# Patient Record
Sex: Male | Born: 2000 | Race: White | Hispanic: No
Health system: Southern US, Community
[De-identification: ages and names within clinical notes are randomized; demographics above are authoritative.]

## PROBLEM LIST (undated history)

## (undated) DIAGNOSIS — F32A Depression, unspecified: Secondary | ICD-10-CM

## (undated) DIAGNOSIS — N200 Calculus of kidney: Secondary | ICD-10-CM

## (undated) DIAGNOSIS — Z8709 Personal history of other diseases of the respiratory system: Secondary | ICD-10-CM

## (undated) DIAGNOSIS — F419 Anxiety disorder, unspecified: Secondary | ICD-10-CM

## (undated) DIAGNOSIS — J45909 Unspecified asthma, uncomplicated: Secondary | ICD-10-CM

## (undated) HISTORY — DX: Anxiety disorder, unspecified: F41.9

## (undated) HISTORY — DX: Personal history of other diseases of the respiratory system: Z87.09

## (undated) HISTORY — DX: Depression, unspecified: F32.A

## (undated) HISTORY — DX: Unspecified asthma, uncomplicated: J45.909

---
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
## 2020-12-03 ENCOUNTER — Other Ambulatory Visit: Payer: Self-pay

## 2020-12-03 ENCOUNTER — Ambulatory Visit (INDEPENDENT_AMBULATORY_CARE_PROVIDER_SITE_OTHER): Payer: BLUE CROSS/BLUE SHIELD | Admitting: Medical

## 2020-12-03 ENCOUNTER — Encounter: Payer: Self-pay | Admitting: Medical

## 2020-12-03 VITALS — BP 140/86 | HR 102 | Ht 75.0 in | Wt 272.6 lb

## 2020-12-03 DIAGNOSIS — Z7722 Contact with and (suspected) exposure to environmental tobacco smoke (acute) (chronic): Secondary | ICD-10-CM | POA: Diagnosis not present

## 2020-12-03 DIAGNOSIS — J4530 Mild persistent asthma, uncomplicated: Secondary | ICD-10-CM

## 2020-12-03 DIAGNOSIS — Z8709 Personal history of other diseases of the respiratory system: Secondary | ICD-10-CM | POA: Diagnosis not present

## 2020-12-03 DIAGNOSIS — R0600 Dyspnea, unspecified: Secondary | ICD-10-CM

## 2020-12-03 DIAGNOSIS — K644 Residual hemorrhoidal skin tags: Secondary | ICD-10-CM

## 2020-12-03 DIAGNOSIS — Z6834 Body mass index (BMI) 34.0-34.9, adult: Secondary | ICD-10-CM | POA: Insufficient documentation

## 2020-12-03 MED ORDER — BREO ELLIPTA 100-25 MCG/INH IN AEPB: 1.0000 | INHALATION_SPRAY | Freq: Every day | RESPIRATORY_TRACT | 0 refills | Status: DC

## 2020-12-03 MED ORDER — HYDROCORTISONE 2.5 % EX CREA: TOPICAL_CREAM | Freq: Two times a day (BID) | CUTANEOUS | 1 refills | Status: DC

## 2020-12-03 NOTE — Progress Notes (Signed)
Subjective:  Ray Blanchard is a 20 y.o. male who presents for Chief Complaint  Patient presents with  . New Patient (Initial Visit)    Pt present as a new patient establishing care. Experiencing some breathing problems for several years. States he feels that he's not getting enough oxygen.      Here as a new patient.   No recent primary care in years  Has had "breathing problems" for years. In adolescent had "bronchitis" 5-7 times per year.   Over time feels like his winded leaves him , can't catch his breath.   When younger, never tested for asthma, but had to have several breathing treatments for bronchitis in younger years. He wants to be tested for breathing issues. Last time he was treated for bronchitis was about age 14yo  Nonsmoker. Grew up in smoker household.  Both parents smoke up until he was about 7yo.  But both parents quit smoking about the time he was 7.     Doesn't engage in any activities that would expose to smoke or chemical now.     Has a spot on his butt that he wants looked at.  Wonders about hemorrhoid .  Uses some hemorrhoid cream occasionally.  Flares up from time to time, but improved with hemorrhoid cream.    PGF had prostate cancer.   He notes BP has been told at times it can be elevated, but he always attributes this to nervousness.    Has scalp issue, can get skin irritation, flakiness, wonders about psoriasis.  Has strong family hx/o depression.  He has thought about seeing a Veterinary surgeon.  No prior medication for mood.  (of note, he brought this up at the end of the visit as I was getting up to be finished for the visit)  No other aggravating or relieving factors.    No other c/o.  Past Medical History:  Diagnosis Date  . Anxiety   . History of frequent upper respiratory infection    since childhood   Current Outpatient Medications on File Prior to Visit  Medication Sig Dispense Refill  . Probiotic Product (PROBIOTIC BLEND PO) Take by mouth.      No current facility-administered medications on file prior to visit.   Family History  Problem Relation Age of Onset  . Hypertension Mother   . Depression Mother   . Kidney Stones Mother   . Depression Father   . Gout Father   . Diabetes Maternal Grandmother   . Depression Maternal Grandmother   . Obesity Maternal Grandmother   . Alcohol abuse Maternal Grandfather   . Diabetes Paternal Grandmother   . Obesity Paternal Grandmother   . Diabetes Paternal Grandfather   . Cancer Paternal Grandfather        prostate     The following portions of the patient's history were reviewed and updated as appropriate: allergies, current medications, past family history, past medical history, past social history, past surgical history and problem list.  ROS Otherwise as in subjective above  Objective: BP 140/86   Pulse (!) 102   Ht 6\' 3"  (1.905 m)   Wt 272 lb 9.6 oz (123.7 kg)   SpO2 98%   BMI 34.07 kg/m   General appearance: alert, no distress, well developed, well nourished, obese white male Skin: unremarkable HEENT: normocephalic, sclerae anicteric, conjunctiva pink and moist, TMs pearly, nares patent, no discharge or erythema, pharynx normal Oral cavity: MMM, no lesions Neck: supple, no lymphadenopathy, no thyromegaly, no masses,  no bruits, no jvd Heart: RRR, normal S1, S2, no murmurs Lungs: CTA bilaterally, no wheezes, rhonchi, or rales Abdomen: +bs, soft, non tender, non distended, no masses, no hepatomegaly, no splenomegaly Pulses: 2+ radial pulses, 2+ pedal pulses, normal cap refill Ext: no edema Rectal -left side of perirectal area with somewhat purplish non thrombosed external hemorrhoid, small to medium size otherwise no fissure or other abnormality  PFT reviewed  Assessment: Encounter Diagnoses  Name Primary?  Marland Kitchen Dyspnea, unspecified type Yes  . History of frequent upper respiratory infection   . History of second hand smoke exposure   . BMI 34.0-34.9,adult   .  External hemorrhoid   . Mild persistent asthma without complication      Plan: Dyspnea, history of frequent URI as a kid, history of secondhand smoke exposure in childhood-PFTs suggest mild asthma and likely some restrictive effect of being obese.  He will begin trial of Breo inhaler for the next 2 weeks to see if this makes any difference in his perceived dyspnea.  Advise he begin daily allergy pill such as Zyrtec over-the-counter for the next month and a half.  Discussed proper use of medicine.  Had him do his first dose today as I demonstrated proper use .  recheck in 2 weeks  External hemorrhoid-advised good hydration and fiber intake daily, avoid straining on the toilet bowl, avoid heavy lifting, can use hydrocortisone prescription cream as needed, sitz bath's, recheck in 2 weeks  BMI greater than 34-counseled on diet and exercise along with efforts to lose weight, which would help his asthma breathing issues  Depressed mood-he brought this up at the very end of the visit.  I offered to send him to a therapist as a first step.  He wants to wait until we recheck given his work schedule.  We will discuss this further at next visit as we did not have enough time to a lot for this today  Ray Blanchard was seen today for new patient (initial visit).  Diagnoses and all orders for this visit:  Dyspnea, unspecified type  History of frequent upper respiratory infection  History of second hand smoke exposure -     Spirometry with Graph  BMI 34.0-34.9,adult  External hemorrhoid  Mild persistent asthma without complication  Other orders -     hydrocortisone 2.5 % cream; Apply topically 2 (two) times daily. -     fluticasone furoate-vilanterol (BREO ELLIPTA) 100-25 MCG/INH AEPB; Inhale 1 puff into the lungs daily.    Follow up: 2wk

## 2020-12-10 HISTORY — PX: NO PAST SURGERIES: SHX2092

## 2020-12-20 ENCOUNTER — Encounter: Payer: Self-pay | Admitting: Medical

## 2020-12-20 ENCOUNTER — Ambulatory Visit: Payer: BLUE CROSS/BLUE SHIELD | Admitting: Medical

## 2020-12-20 ENCOUNTER — Other Ambulatory Visit: Payer: Self-pay

## 2020-12-20 VITALS — BP 134/88 | HR 94 | Ht 75.0 in | Wt 273.2 lb

## 2020-12-20 DIAGNOSIS — J4540 Moderate persistent asthma, uncomplicated: Secondary | ICD-10-CM

## 2020-12-20 DIAGNOSIS — K644 Residual hemorrhoidal skin tags: Secondary | ICD-10-CM | POA: Diagnosis not present

## 2020-12-20 MED ORDER — ALBUTEROL SULFATE HFA 108 (90 BASE) MCG/ACT IN AERS: 2.0000 | INHALATION_SPRAY | Freq: Four times a day (QID) | RESPIRATORY_TRACT | 1 refills | Status: DC | PRN

## 2020-12-20 MED ORDER — BREO ELLIPTA 100-25 MCG/INH IN AEPB: 1.0000 | INHALATION_SPRAY | Freq: Two times a day (BID) | RESPIRATORY_TRACT | 2 refills | Status: DC

## 2020-12-20 NOTE — Progress Notes (Signed)
Subjective:  Ray Blanchard is a 20 y.o. male who presents for Chief Complaint  Patient presents with  . Shortness of Breath    Pt present for 2 week follow up on shortness of breath. Pt states he feels better      Recently established here as a new patient on 12/03/20.  On that visit we started Breo sample and allergy medication daily to help with asthma diagnosis.   He reports improvements with the Breo compared to prior.  He notes at night gets hot in his room which can seem to affect his breathing.    Has had "breathing problems" for years. In adolescent had "bronchitis" 5-7 times per year.   Over time feels like his winded leaves him , can't catch his breath.   When younger, never tested for asthma, but had to have several breathing treatments for bronchitis in younger years. He wants to be tested for breathing issues. Last time he was treated for bronchitis was about age 45yo  Nonsmoker. Grew up in smoker household.  Both parents smoke up until he was about 7yo.  But both parents quit smoking about the time he was 7.     Doesn't engage in any activities that would expose to smoke or chemical now.     Last visit we discussed hemorrhoids, constipation.  Has been eating more salads, more fiber, more water intake.   Rectal cream has helped.  Has also tried some sitz baths.     No other aggravating or relieving factors.    No other c/o.  Past Medical History:  Diagnosis Date  . Anxiety   . History of frequent upper respiratory infection    since childhood   Current Outpatient Medications on File Prior to Visit  Medication Sig Dispense Refill  . hydrocortisone 2.5 % cream Apply topically 2 (two) times daily. 30 g 1  . Probiotic Product (PROBIOTIC BLEND PO) Take by mouth.     No current facility-administered medications on file prior to visit.   Family History  Problem Relation Age of Onset  . Hypertension Mother   . Depression Mother   . Kidney Stones Mother   . Depression  Father   . Gout Father   . Diabetes Maternal Grandmother   . Depression Maternal Grandmother   . Obesity Maternal Grandmother   . Alcohol abuse Maternal Grandfather   . Diabetes Paternal Grandmother   . Obesity Paternal Grandmother   . Diabetes Paternal Grandfather   . Cancer Paternal Grandfather        prostate   The following portions of the patient's history were reviewed and updated as appropriate: allergies, current medications, past family history, past medical history, past social history, past surgical history and problem list.  ROS Otherwise as in subjective above  Objective: BP 134/88   Pulse 94   Ht 6\' 3"  (1.905 m)   Wt 273 lb 3.2 oz (123.9 kg)   SpO2 98%   BMI 34.15 kg/m   Wt Readings from Last 3 Encounters:  12/20/20 273 lb 3.2 oz (123.9 kg)  12/03/20 272 lb 9.6 oz (123.7 kg)    General appearance: alert, no distress, well developed, well nourished, obese white male Neck: supple, no lymphadenopathy, no thyromegaly, no masses, no bruits, no jvd Heart: RRR, normal S1, S2, no murmurs Lungs: CTA bilaterally, no wheezes, rhonchi, or rales Pulses: 2+ radial pulses, 2+ pedal pulses, normal cap refill Ext: no edema  PFT reviewed from last visit  Assessment: Encounter Diagnoses  Name Primary?  . Moderate persistent asthma, unspecified whether complicated Yes  . External hemorrhoid      Plan: Asthma: . Avoid asthma triggers such as pollen, smoke, or cold temperatures for example . Your asthma is currently considered mild persistent asthma. Rickard Rhymes Breo inhaled medication twice daily for prevention.  This medication is a Chief of Staff" medication. . You may use "Albuterol" rescue inhaler 1-2 puffs every 4-6 hours as needed for wheezing, shortness of breath, chest tightness or cough spells.   This is a Data processing manager" medication. . You may use "Albuterol" rescue nebulized treatment every 4-6 hours as needed for wheezing, shortness of breath, chest tightness or cough  spells.   This is a Data processing manager" medication. . Continue your allergy tablet daily at bedtime to help reduce allergy symptoms which can trigger asthma symptoms.  . Some people need to continue this regimen in the spring and fall, some people may only need to use this regimen during times of respiratory illness such as colds and flu, but some people with severe asthma need to continue this regimen year round. . Call if you have questions, or call or return if you are not improving on this regimen.  . Plan to recheck/call back to give me update on asthma in the summer   ASTHMA MEDICATIONS Two types of medications are used in asthma treatment: quick relief (or rescue) medications and controller medications. All patients with persistent asthma should have a short-acting relief medication on hand for treatment of exacerbations and a controller medication for long-term control.    Rescue medications include Albuterol, Proventil, Ventolin, ProAir, Xopenex.  Rescue medications, also called quick-relief or fast-acting medications, work immediately to relieve asthma symptoms when they occur. They're often inhaled directly into the lungs, where they open up the airways and relieve symptoms such as wheezing, coughing, and shortness of breath, often within minutes. But as effective as they are, rescue medications don't have a long-term effect.  Controller medications include inhaled corticosteroids (ICSs), long-acting beta agonists (LABAs), leukotriene modifiers, anti-IgE medications, and combination products.  Examples include Qvar, Asmanex, Pulmicort, Advair, Symbicort, and Dulera.  Controller medications, also called preventive or maintenance medications, work over a period of time to reduce airway inflammation and help prevent asthma symptoms from occurring. They may be inhaled or swallowed as a pill or liquid.   External hemorrhoid - improved.  C/t good hydration and fiber intake daily, avoid straining on the  toilet bowl, avoid heavy lifting, can use hydrocortisone prescription cream as needed, sitz bath's  Titus was seen today for shortness of breath.  Diagnoses and all orders for this visit:  Moderate persistent asthma, unspecified whether complicated  External hemorrhoid  Other orders -     fluticasone furoate-vilanterol (BREO ELLIPTA) 100-25 MCG/INH AEPB; Inhale 1 puff into the lungs in the morning and at bedtime. -     albuterol (VENTOLIN HFA) 108 (90 Base) MCG/ACT inhaler; Inhale 2 puffs into the lungs every 6 (six) hours as needed for wheezing or shortness of breath.    Follow up: 2wk

## 2020-12-20 NOTE — Patient Instructions (Signed)
Asthma: . Avoid asthma triggers such as pollen, smoke, or cold temperatures for example . Your asthma is currently considered mild persistent asthma. Rickard Rhymes Breo inhaled medication twice daily for prevention.  This medication is a Chief of Staff" medication. . You may use "Albuterol" rescue inhaler 1-2 puffs every 4-6 hours as needed for wheezing, shortness of breath, chest tightness or cough spells.   This is a Data processing manager" medication. . You may use "Albuterol" rescue nebulized treatment every 4-6 hours as needed for wheezing, shortness of breath, chest tightness or cough spells.   This is a Data processing manager" medication. . Continue your allergy tablet daily at bedtime to help reduce allergy symptoms which can trigger asthma symptoms.  . Some people need to continue this regimen in the spring and fall, some people may only need to use this regimen during times of respiratory illness such as colds and flu, but some people with severe asthma need to continue this regimen year round. . Call if you have questions, or call or return if you are not improving on this regimen.  . Plan to recheck/call back to give me update on asthma in the summer   ASTHMA MEDICATIONS Two types of medications are used in asthma treatment: quick relief (or rescue) medications and controller medications. All patients with persistent asthma should have a short-acting relief medication on hand for treatment of exacerbations and a controller medication for long-term control.    Rescue medications include Albuterol, Proventil, Ventolin, ProAir, Xopenex.  Rescue medications, also called quick-relief or fast-acting medications, work immediately to relieve asthma symptoms when they occur. They're often inhaled directly into the lungs, where they open up the airways and relieve symptoms such as wheezing, coughing, and shortness of breath, often within minutes. But as effective as they are, rescue medications don't have a long-term  effect.  Controller medications include inhaled corticosteroids (ICSs), long-acting beta agonists (LABAs), leukotriene modifiers, anti-IgE medications, and combination products.  Examples include Qvar, Asmanex, Pulmicort, Advair, Symbicort, and Dulera.  Controller medications, also called preventive or maintenance medications, work over a period of time to reduce airway inflammation and help prevent asthma symptoms from occurring. They may be inhaled or swallowed as a pill or liquid.

## 2020-12-22 ENCOUNTER — Other Ambulatory Visit: Payer: Self-pay | Admitting: Medical

## 2020-12-22 MED ORDER — QVAR REDIHALER 80 MCG/ACT IN AERB: 2.0000 | INHALATION_SPRAY | Freq: Two times a day (BID) | RESPIRATORY_TRACT | 2 refills | Status: DC

## 2020-12-23 ENCOUNTER — Encounter: Payer: Self-pay | Admitting: Medical

## 2020-12-23 ENCOUNTER — Ambulatory Visit (INDEPENDENT_AMBULATORY_CARE_PROVIDER_SITE_OTHER): Payer: BLUE CROSS/BLUE SHIELD | Admitting: Medical

## 2020-12-23 ENCOUNTER — Other Ambulatory Visit: Payer: Self-pay

## 2020-12-23 VITALS — BP 130/80 | HR 89 | Ht 75.0 in | Wt 272.4 lb

## 2020-12-23 DIAGNOSIS — Z7185 Encounter for immunization safety counseling: Secondary | ICD-10-CM | POA: Insufficient documentation

## 2020-12-23 DIAGNOSIS — J4540 Moderate persistent asthma, uncomplicated: Secondary | ICD-10-CM

## 2020-12-23 DIAGNOSIS — Z13220 Encounter for screening for lipoid disorders: Secondary | ICD-10-CM

## 2020-12-23 DIAGNOSIS — Z1159 Encounter for screening for other viral diseases: Secondary | ICD-10-CM

## 2020-12-23 DIAGNOSIS — F419 Anxiety disorder, unspecified: Secondary | ICD-10-CM

## 2020-12-23 DIAGNOSIS — Z0000 Encounter for general adult medical examination without abnormal findings: Secondary | ICD-10-CM

## 2020-12-23 DIAGNOSIS — F32A Depression, unspecified: Secondary | ICD-10-CM

## 2020-12-23 DIAGNOSIS — Z113 Encounter for screening for infections with a predominantly sexual mode of transmission: Secondary | ICD-10-CM

## 2020-12-23 LAB — LIPID PANEL

## 2020-12-23 LAB — HEPATITIS C ANTIBODY

## 2020-12-23 LAB — CBC WITH DIFFERENTIAL/PLATELET
Basos: 1 %
MCH: 29.1 pg (ref 26.6–33.0)
MCHC: 34.2 g/dL (ref 31.5–35.7)
MCV: 85 fL (ref 79–97)
WBC: 6.1 10*3/uL (ref 3.4–10.8)

## 2020-12-23 LAB — COMPREHENSIVE METABOLIC PANEL
Chloride: 98 mmol/L (ref 96–106)
Potassium: 4.4 mmol/L (ref 3.5–5.2)

## 2020-12-23 MED ORDER — BUPROPION HCL ER (XL) 150 MG PO TB24: 150.0000 mg | ORAL_TABLET | Freq: Every day | ORAL | 2 refills | Status: DC

## 2020-12-23 NOTE — Progress Notes (Signed)
Subjective:     Ray Blanchard is a 20 y.o. male who presents for a physical  Here for routine physical, screening.    We briefly discussed mood his first visit.  Here to discuss this further.  Depressed mood since teenage years, 20yo around that time.   Home environment and school environment contributed to depression.  Dropped out of school and ended up going to get GED.  At one point with PGF passed, his father got real depressed.  Would like to try treatment    No prior counseling.  Working full time currently.   Engaged, has good relationship with fiance.   She came from abusive family.  Past Medical History:  Diagnosis Date  . Anxiety   . Asthma   . Depression   . History of frequent upper respiratory infection    since childhood    Family History  Problem Relation Age of Onset  . Hypertension Mother   . Depression Mother   . Kidney Stones Mother   . Depression Father   . Gout Father   . Diabetes Maternal Grandmother   . Depression Maternal Grandmother   . Obesity Maternal Grandmother   . Alcohol abuse Maternal Grandfather   . Diabetes Paternal Grandmother   . Obesity Paternal Grandmother   . Diabetes Paternal Grandfather   . Cancer Paternal Grandfather        prostate     Current Outpatient Medications:  .  albuterol (VENTOLIN HFA) 108 (90 Base) MCG/ACT inhaler, Inhale 2 puffs into the lungs every 6 (six) hours as needed for wheezing or shortness of breath., Disp: 18 g, Rfl: 1 .  beclomethasone (QVAR REDIHALER) 80 MCG/ACT inhaler, Inhale 2 puffs into the lungs 2 (two) times daily., Disp: 1 each, Rfl: 2 .  hydrocortisone 2.5 % cream, Apply topically 2 (two) times daily., Disp: 30 g, Rfl: 1 .  Probiotic Product (PROBIOTIC BLEND PO), Take by mouth., Disp: , Rfl:  .  buPROPion (WELLBUTRIN XL) 150 MG 24 hr tablet, Take 1 tablet (150 mg total) by mouth daily., Disp: 30 tablet, Rfl: 2  No Known Allergies   The following portions of the patient's history were reviewed  and updated as appropriate: allergies, current medications, past family history, past medical history, past social history, past surgical history.  Review of Systems A comprehensive review of systems was negative   Objective:    BP 130/80   Pulse 89   Ht 6\' 3"  (1.905 m)   Wt 272 lb 6.4 oz (123.6 kg)   SpO2 98%   BMI 34.05 kg/m   General Appearance:  Alert, cooperative, no distress, appropriate for age, WD/ WN, white male                            Head:  Normocephalic, without obvious abnormality                             Eyes:  PERRL, EOM's intact, conjunctiva and cornea clear, fundi benign, both eyes                             Ears:  TM pearly, external ear canals normal, both ears                            Nose:  Nares symmetrical, septum midline, mucosa pink, no lesions                                Throat:  Lips, tongue, and mucosa are moist, pink, and intact; teeth intact                             Neck:  Supple, no adenopathy, no thyromegaly, no tenderness/mass/nodules, no carotid bruit, no JVD                             Back:  Symmetrical, no curvature, ROM normal, no tenderness                           Lungs:  Clear to auscultation bilaterally, respirations unlabored                             Heart:  Normal PMI, regular rate & rhythm, S1 and S2 normal, no murmurs, rubs, or gallops                     Abdomen:  Striae, Soft, non-tender, bowel sounds active all four quadrants, no mass or organomegaly              Genitourinary: normal male genitalia, circ, no masses, no hernia         Musculoskeletal:  Normal upper and lower extremity ROM, tone and strength strong and symmetrical, all extremities; no joint pain or edema                                       Lymphatic:  No adenopathy             Skin/Hair/Nails:  Skin warm, dry and intact, no rashes or abnormal dyspigmentation                   Neurologic:  Alert and oriented x3, no cranial nerve deficits, normal  strength and tone, gait steady Psych- pleasant, good eye contact, answers questions appropriately   Depression screen [facility] 2/9 12/23/2020 12/03/2020  Decreased Interest 3 3  Down, Depressed, Hopeless 3 3  PHQ - 2 Score 6 6  Altered sleeping 3 1  Tired, decreased energy 3 1  Change in appetite 1 1  Feeling bad or failure about yourself  2 3  Trouble concentrating 2 1  Moving slowly or fidgety/restless 2 1  Suicidal thoughts 1 1  PHQ-9 Score 20 15  Difficult doing work/chores Very difficult -     Assessment:   Encounter Diagnoses  Name Primary?  . Encounter for health maintenance examination in adult Yes  . Vaccine counseling   . Moderate persistent asthma, unspecified whether complicated   . Anxiety and depression   . Screen for STD (sexually transmitted disease)   . Screening for lipid disorders   . Encounter for hepatitis C screening test for low risk patient       Plan:   Today you had a preventative care visit or wellness visit.    Topics today may have included healthy lifestyle, diet, exercise, preventative care, vaccinations, sick and well care, proper use of emergency dept  and after hours care, as well as other concerns.     Recommendations: Continue to return yearly for your annual wellness and preventative care visits.  This gives [place] a chance to discuss healthy lifestyle, exercise, vaccinations, review your chart record, and perform screenings where appropriate.  I recommend you see your eye doctor yearly for routine vision care.  I recommend you see your dentist yearly for routine dental care including hygiene visits twice yearly.   Vaccination recommendations were reviewed  Request prior vaccine records   Screening for cancer: Check testicles monthly for lumps  Skin cancer screening: Check your skin regularly for new changes, growing lesions, or other lesions of concern Come in for evaluation if you have skin lesions of concern.  Lung cancer  screening: If you have a greater than 30 pack year history of tobacco use, then you qualify for lung cancer screening with a chest CT scan  We currently don't have screenings for other cancers besides breast, cervical, colon, and lung cancers.  If you have a strong family history of cancer or have other cancer screening concerns, please let me know.    Bone health: Get at least 150 minutes of aerobic exercise weekly Get weight bearing exercise at least once weekly   Heart health: Get at least 150 minutes of aerobic exercise weekly Limit alcohol It is important to maintain a healthy blood pressure and healthy cholesterol numbers    Separate significant issues discussed: depression - referral to counseling, begin medicaiton below. Discussed risks/benefit of proper medicaiton.  Discussed f/u.   Asthma - doing fine on current regimen  Gryphon was seen today for annual exam.  Diagnoses and all orders for this visit:  Encounter for health maintenance examination in adult -     Comprehensive metabolic panel -     CBC with Differential/Platelet -     Lipid panel -     TSH -     HIV Antibody (routine testing w rflx) -     RPR -     GC/Chlamydia Probe Amp -     Hepatitis C antibody -     Hepatitis B surface antigen  Vaccine counseling  Moderate persistent asthma, unspecified whether complicated  Anxiety and depression  Screen for STD (sexually transmitted disease) -     HIV Antibody (routine testing w rflx) -     RPR -     GC/Chlamydia Probe Amp -     Hepatitis C antibody -     Hepatitis B surface antigen  Screening for lipid disorders -     Lipid panel  Encounter for hepatitis C screening test for low risk patient -     HIV Antibody (routine testing w rflx) -     RPR -     GC/Chlamydia Probe Amp -     Hepatitis C antibody -     Hepatitis B surface antigen  Other orders -     buPROPion (WELLBUTRIN XL) 150 MG 24 hr tablet; Take 1 tablet (150 mg total) by mouth  daily.      F/u 3-4 weeks

## 2020-12-23 NOTE — Progress Notes (Signed)
Done

## 2020-12-23 NOTE — Patient Instructions (Signed)
Look up Ray Blanchard podcasts and inquire about his [employer]    I would like to invite you to my church, [employer] if you are not attending church  Contact info: [address] [place], [place] [address]  [phone] [email or web address]

## 2020-12-24 LAB — LIPID PANEL
Chol/HDL Ratio: 4 ratio (ref 0.0–5.0)
Cholesterol, Total: 127 mg/dL (ref 100–199)
HDL: 32 mg/dL — ABNORMAL LOW (ref >39)
Triglycerides: 88 mg/dL (ref 0–149)

## 2020-12-24 LAB — COMPREHENSIVE METABOLIC PANEL
ALT: 60 IU/L — ABNORMAL HIGH (ref 0–44)
AST: 29 IU/L (ref 0–40)
Albumin: 5 g/dL (ref 4.1–5.2)
Alkaline Phosphatase: 95 IU/L (ref 44–121)
BUN/Creatinine Ratio: 18 (ref 9–20)
Bilirubin Total: 0.8 mg/dL (ref 0.0–1.2)
CO2: 21 mmol/L (ref 20–29)
Calcium: 9.9 mg/dL (ref 8.7–10.2)
Creatinine, Ser: 0.61 mg/dL — ABNORMAL LOW (ref 0.76–1.27)
Globulin, Total: 2.9 g/dL (ref 1.5–4.5)
Glucose: 84 mg/dL (ref 65–99)
Sodium: 140 mmol/L (ref 134–144)
Total Protein: 7.9 g/dL (ref 6.0–8.5)
eGFR: 140 mL/min/{1.73_m2} (ref >59)

## 2020-12-24 LAB — CBC WITH DIFFERENTIAL/PLATELET
Basophils Absolute: 0 10*3/uL (ref 0.0–0.2)
EOS (ABSOLUTE): 0.2 10*3/uL (ref 0.0–0.4)
Eos: 3 %
Hematocrit: 48.8 % (ref 37.5–51.0)
Hemoglobin: 16.7 g/dL (ref 13.0–17.7)
Immature Grans (Abs): 0 10*3/uL (ref 0.0–0.1)
Immature Granulocytes: 0 %
Lymphocytes Absolute: 1.4 10*3/uL (ref 0.7–3.1)
Lymphs: 23 %
Monocytes Absolute: 0.5 10*3/uL (ref 0.1–0.9)
Monocytes: 7 %
Neutrophils Absolute: 4.1 10*3/uL (ref 1.4–7.0)
Neutrophils: 66 %
Platelets: 238 10*3/uL (ref 150–450)
RBC: 5.74 x10E6/uL (ref 4.14–5.80)
RDW: 13.2 % (ref 11.6–15.4)

## 2020-12-24 LAB — HIV ANTIBODY (ROUTINE TESTING W REFLEX): HIV Screen 4th Generation wRfx: NONREACTIVE

## 2020-12-24 LAB — RPR: RPR Ser Ql: NONREACTIVE

## 2020-12-24 LAB — TSH: TSH: 1.89 u[IU]/mL (ref 0.450–4.500)

## 2020-12-24 LAB — HEPATITIS B SURFACE ANTIGEN: Hepatitis B Surface Ag: NEGATIVE

## 2020-12-25 LAB — GC/CHLAMYDIA PROBE AMP
Chlamydia trachomatis, NAA: NEGATIVE
Neisseria Gonorrhoeae by PCR: NEGATIVE

## 2021-01-04 ENCOUNTER — Other Ambulatory Visit: Payer: Self-pay

## 2021-01-04 ENCOUNTER — Telehealth: Payer: Self-pay | Admitting: Medical

## 2021-01-04 DIAGNOSIS — J4540 Moderate persistent asthma, uncomplicated: Secondary | ICD-10-CM

## 2021-01-04 NOTE — Telephone Encounter (Signed)
I recommend allergist referral to discuss other options for therapy including allergy shots which work well for a lot of people  See his email

## 2021-01-04 NOTE — Telephone Encounter (Signed)
error 

## 2021-01-04 NOTE — Telephone Encounter (Signed)
Referral has been sent.

## 2021-01-24 ENCOUNTER — Other Ambulatory Visit: Payer: Self-pay | Admitting: Medical

## 2021-01-24 MED ORDER — BUPROPION HCL ER (XL) 300 MG PO TB24: 300.0000 mg | ORAL_TABLET | ORAL | 1 refills | Status: DC

## 2021-03-24 ENCOUNTER — Ambulatory Visit: Payer: BLUE CROSS/BLUE SHIELD | Admitting: Medical

## 2021-03-24 ENCOUNTER — Other Ambulatory Visit: Payer: Self-pay

## 2021-03-24 VITALS — BP 134/86 | HR 99 | Temp 99.0°F | Wt 273.0 lb

## 2021-03-24 DIAGNOSIS — R0683 Snoring: Secondary | ICD-10-CM

## 2021-03-24 DIAGNOSIS — F419 Anxiety disorder, unspecified: Secondary | ICD-10-CM

## 2021-03-24 DIAGNOSIS — Z6834 Body mass index (BMI) 34.0-34.9, adult: Secondary | ICD-10-CM | POA: Diagnosis not present

## 2021-03-24 DIAGNOSIS — R7989 Other specified abnormal findings of blood chemistry: Secondary | ICD-10-CM

## 2021-03-24 DIAGNOSIS — F32A Depression, unspecified: Secondary | ICD-10-CM

## 2021-03-24 DIAGNOSIS — E786 Lipoprotein deficiency: Secondary | ICD-10-CM | POA: Insufficient documentation

## 2021-03-24 DIAGNOSIS — R400 Somnolence: Secondary | ICD-10-CM | POA: Diagnosis not present

## 2021-03-24 MED ORDER — BUPROPION HCL ER (XL) 300 MG PO TB24: 300.0000 mg | ORAL_TABLET | ORAL | 1 refills | Status: DC

## 2021-03-24 NOTE — Progress Notes (Signed)
Subjective:  Ray Blanchard is a 20 y.o. male who presents for Chief Complaint  Patient presents with   med check     Non fasting     Here for f/u.  Last visit in April was his physical.  At that time his HDL was low and LFTs mildly elevated.  Here to discuss.  He drinks minimal alcohol.   He has used some tylenol and ibuprofen quite regularly recently given some wrist pains.  No family history of hemachromatosis.    Anxiety and depression - last visit we started Wellbutrin and in the last 2 months he called back and we increased to 300 mg Wellbutrin daily.  This is helping quite a bit.  He note his fiance notes a big difference.  He has more motivation and more happy about hobbies lately.    He hasn't seen counseling yet . He finally got back on his job at the [employer].   He was temporarily out of work until recently.  Happy about getting back to work..  he has done paperwork to start [facility] online.  Has concern for sleep apnea.  He snores.  Others say he has symptoms of sleep apnea.   Has daytime somnolence, awakes several times in the night.  No other aggravating or relieving factors.    No other c/o.  Past Medical History:  Diagnosis Date   Anxiety    Asthma    Depression    History of frequent upper respiratory infection    since childhood   Current Outpatient Medications on File Prior to Visit  Medication Sig Dispense Refill   albuterol (VENTOLIN HFA) 108 (90 Base) MCG/ACT inhaler Inhale 2 puffs into the lungs every 6 (six) hours as needed for wheezing or shortness of breath. 18 g 1   beclomethasone (QVAR REDIHALER) 80 MCG/ACT inhaler Inhale 2 puffs into the lungs 2 (two) times daily. 1 each 2   hydrocortisone 2.5 % cream Apply topically 2 (two) times daily. 30 g 1   Probiotic Product (PROBIOTIC BLEND PO) Take by mouth.     No current facility-administered medications on file prior to visit.    The following portions of the patient's history were  reviewed and updated as appropriate: allergies, current medications, past family history, past medical history, past social history, past surgical history and problem list.  ROS Otherwise as in subjective above    Objective: BP 134/86   Pulse 99   Temp 99 F (37.2 C)   Wt 273 lb (123.8 kg)   SpO2 97%   BMI 34.12 kg/m   General appearance: alert, no distress, well developed, well nourished Psych: pleasant, good eye contact, answers questions appropriately    Assessment: Encounter Diagnoses  Name Primary?   Anxiety and depression Yes   Snoring    BMI 34.0-34.9,adult    Daytime somnolence    HDL deficiency    Elevated LFTs      Plan: Anxiety and depression - doing well on Wellbutrin.  C/t 300mg  Wellbutrin daily.  Start counseling with [facility].  F/u 3-4 months  Snoring, daytime somnolence, family history of sleep apnea in father - referral to neurology for sleep eval  Elevated LFT - negative for Hep B and hep C on labs 12/2020.  Advised he limit alcohol and analgesics.   Recheck liver tests next visit  Work on lifestyle changes, weight loss  HDL low - counseling on diet changes   Teri was seen today for med  check .  Diagnoses and all orders for this visit:  Anxiety and depression  Snoring -     Ambulatory referral to Neurology  BMI 34.0-34.9,adult -     Ambulatory referral to Neurology  Daytime somnolence -     Ambulatory referral to Neurology  HDL deficiency  Elevated LFTs  Other orders -     buPROPion (WELLBUTRIN XL) 300 MG 24 hr tablet; Take 1 tablet (300 mg total) by mouth every morning.   Follow up: 76mo

## 2021-03-28 ENCOUNTER — Other Ambulatory Visit: Payer: Self-pay | Admitting: Medical

## 2021-03-28 MED ORDER — BUPROPION HCL ER (XL) 300 MG PO TB24: 300.0000 mg | ORAL_TABLET | ORAL | 1 refills | Status: DC

## 2021-05-20 ENCOUNTER — Other Ambulatory Visit: Payer: Self-pay | Admitting: Medical

## 2021-08-02 ENCOUNTER — Other Ambulatory Visit: Payer: Self-pay | Admitting: Medical

## 2021-08-02 MED ORDER — QVAR REDIHALER 80 MCG/ACT IN AERB: 2.0000 | INHALATION_SPRAY | Freq: Two times a day (BID) | RESPIRATORY_TRACT | 6 refills | Status: DC

## 2021-08-02 MED ORDER — ALBUTEROL SULFATE HFA 108 (90 BASE) MCG/ACT IN AERS: 2.0000 | INHALATION_SPRAY | Freq: Four times a day (QID) | RESPIRATORY_TRACT | 1 refills | Status: DC | PRN

## 2021-08-03 ENCOUNTER — Telehealth: Payer: Self-pay

## 2021-08-03 NOTE — Telephone Encounter (Signed)
Received P.A. for albuterol inhaler, called pharmacy & went thru insurance & pt picked up

## 2021-11-18 ENCOUNTER — Other Ambulatory Visit: Payer: Self-pay | Admitting: Medical

## 2021-11-21 ENCOUNTER — Ambulatory Visit (INDEPENDENT_AMBULATORY_CARE_PROVIDER_SITE_OTHER): Payer: 59 | Admitting: Medical

## 2021-11-21 ENCOUNTER — Other Ambulatory Visit: Payer: Self-pay

## 2021-11-21 VITALS — BP 110/64 | HR 68

## 2021-11-21 DIAGNOSIS — R7989 Other specified abnormal findings of blood chemistry: Secondary | ICD-10-CM

## 2021-11-21 DIAGNOSIS — F419 Anxiety disorder, unspecified: Secondary | ICD-10-CM | POA: Diagnosis not present

## 2021-11-21 DIAGNOSIS — Z6834 Body mass index (BMI) 34.0-34.9, adult: Secondary | ICD-10-CM | POA: Diagnosis not present

## 2021-11-21 DIAGNOSIS — J4540 Moderate persistent asthma, uncomplicated: Secondary | ICD-10-CM | POA: Diagnosis not present

## 2021-11-21 DIAGNOSIS — Z7185 Encounter for immunization safety counseling: Secondary | ICD-10-CM

## 2021-11-21 DIAGNOSIS — F32A Depression, unspecified: Secondary | ICD-10-CM

## 2021-11-21 NOTE — Progress Notes (Signed)
Subjective: ? Ray Blanchard is a 21 y.o. male who presents for ?Chief Complaint  ?Patient presents with  ? med check  ?  Med check- having some issues with work from injury so having some depression issues based off that otherwise doing well. Declines flu and covid  ?   ?Here for med check ? ?Asthma - no recent problems.  Using Qvar daily, albuterol occasionally prn.  Rinses mouth out after use of  ?Qvar ? ?Here for recheck on elevated liver tests that were mildly elevated several months ago.  He ended up seeing urgent care in 08/2021 for illness.  Liver test were repeated there.   They felt it was fatty liver related and to work on healthy diet and weight loss. ? ?Anxiety - mood was going ok and Wellbutrin doing ok until 09/2021. He had work related injury and mood has been down at times, angry at times.   He is working with workers comp, [facility] related to right ankle and foot injury at work.   Was in boot for a while, then a cast, now back to a boot again.   Still has a lot of pain ? ?Otherwise in normal state of health ? ?No other aggravating or relieving factors.   ? ?No other c/o. ? ?The following portions of the patient's history were reviewed and updated as appropriate: allergies, current medications, past family history, past medical history, past social history, past surgical history and problem list. ? ?ROS ?Otherwise as in subjective above ? ?Objective: ?BP 110/64   Pulse 68  ? ? ?General appearance: alert, no distress, well developed, well nourished ?Neck: supple, no lymphadenopathy, no thyromegaly, no masses ?Heart: RRR, normal S1, S2, no murmurs ?Lungs: CTA bilaterally, no wheezes, rhonchi, or rales ?Abdomen: +bs, soft, non tender, non distended, no masses, no hepatomegaly, no splenomegaly ?Pulses: 2+ radial pulses, 2+ pedal pulses, normal cap refill ?Ext: no edema ? ? ?Assessment: ?Encounter Diagnoses  ?Name Primary?  ? Moderate persistent asthma, unspecified whether complicated Yes  ? Anxiety  and depression   ? BMI 34.0-34.9,adult   ? Vaccine counseling   ? Elevated LFTs   ? ? ? ?Plan: ?Asthma-doing fine on current regimen.  Continue Qvar preventative inhaler.  Continue albuterol as needed ? ?Elevated liver test-we will get a copy of recent labs he had a urgent care that included liver test.  Continue efforts to lose weight through healthy diet and exercise.  Elevated liver test likely due to fatty liver disease. ? ?Anxiety and depression-continue Wellbutrin 300 mg XL daily. ? ?Vaccine counseling-he will get [place] a copy of his vaccine records he has at home.  His records are not in the [place] state immunization registry.  He could be due for HPV, tetanus and pneumococcal vaccine which are recommended ? ?He unfortunately had an injury at work and is working with [employer].  I wished him well on going through the recovery and getting his foot and ankle back to good standing ? ?Ray Blanchard was seen today for med check. ? ?Diagnoses and all orders for this visit: ? ?Moderate persistent asthma, unspecified whether complicated ? ?Anxiety and depression ? ?BMI 34.0-34.9,adult ? ?Vaccine counseling ? ?Elevated LFTs ? ? ? ?Follow up: yearly for physical ?

## 2021-11-23 ENCOUNTER — Other Ambulatory Visit: Payer: Self-pay | Admitting: Medical

## 2021-12-01 ENCOUNTER — Telehealth: Payer: Self-pay | Admitting: Medical

## 2021-12-01 NOTE — Telephone Encounter (Signed)
Requested records received from [facility]  °

## 2021-12-05 ENCOUNTER — Telehealth: Payer: Self-pay | Admitting: Medical

## 2021-12-05 NOTE — Telephone Encounter (Signed)
Left message for pt to call me back 

## 2021-12-05 NOTE — Telephone Encounter (Signed)
I received the labs from urgent care from November 2022.  Liver tests are just slightly elevated. ? ?We will monitor for now.   Plan year physical in the coming months.   ? ? ?

## 2021-12-09 ENCOUNTER — Encounter: Payer: Self-pay | Admitting: Medical

## 2021-12-10 ENCOUNTER — Other Ambulatory Visit: Payer: Self-pay | Admitting: Medical

## 2022-02-27 ENCOUNTER — Encounter: Payer: Self-pay | Admitting: Medical

## 2022-02-27 ENCOUNTER — Ambulatory Visit (INDEPENDENT_AMBULATORY_CARE_PROVIDER_SITE_OTHER): Payer: 59 | Admitting: Medical

## 2022-02-27 VITALS — BP 130/82 | HR 94 | Temp 98.6°F | Ht 75.5 in | Wt 288.6 lb

## 2022-02-27 DIAGNOSIS — R200 Anesthesia of skin: Secondary | ICD-10-CM

## 2022-02-27 DIAGNOSIS — R0683 Snoring: Secondary | ICD-10-CM

## 2022-02-27 DIAGNOSIS — J4540 Moderate persistent asthma, uncomplicated: Secondary | ICD-10-CM

## 2022-02-27 DIAGNOSIS — Z131 Encounter for screening for diabetes mellitus: Secondary | ICD-10-CM

## 2022-02-27 DIAGNOSIS — Z0000 Encounter for general adult medical examination without abnormal findings: Secondary | ICD-10-CM

## 2022-02-27 DIAGNOSIS — M79602 Pain in left arm: Secondary | ICD-10-CM

## 2022-02-27 DIAGNOSIS — Z7185 Encounter for immunization safety counseling: Secondary | ICD-10-CM

## 2022-02-27 DIAGNOSIS — M79601 Pain in right arm: Secondary | ICD-10-CM | POA: Insufficient documentation

## 2022-02-27 DIAGNOSIS — G8929 Other chronic pain: Secondary | ICD-10-CM

## 2022-02-27 DIAGNOSIS — Z1329 Encounter for screening for other suspected endocrine disorder: Secondary | ICD-10-CM

## 2022-02-27 DIAGNOSIS — R7989 Other specified abnormal findings of blood chemistry: Secondary | ICD-10-CM

## 2022-02-27 DIAGNOSIS — M25512 Pain in left shoulder: Secondary | ICD-10-CM

## 2022-02-27 DIAGNOSIS — Z6835 Body mass index (BMI) 35.0-35.9, adult: Secondary | ICD-10-CM | POA: Diagnosis not present

## 2022-02-27 DIAGNOSIS — Z139 Encounter for screening, unspecified: Secondary | ICD-10-CM

## 2022-02-27 LAB — CBC
Hematocrit: 46.7 % (ref 37.5–51.0)
MCH: 29.1 pg (ref 26.6–33.0)
MCV: 84 fL (ref 79–97)
Platelets: 257 10*3/uL (ref 150–450)
RDW: 13.2 % (ref 11.6–15.4)

## 2022-02-27 LAB — LIPID PANEL

## 2022-02-27 LAB — COMPREHENSIVE METABOLIC PANEL

## 2022-02-27 LAB — HEMOGLOBIN A1C: Hgb A1c MFr Bld: 4.8 % (ref 4.8–5.6)

## 2022-02-27 NOTE — Progress Notes (Signed)
Subjective:   HPI  Ray Blanchard is a 21 y.o. male who presents for Chief Complaint  Patient presents with   Annual Exam    CPE pt. Has issues with arms getting numb and tingly down to his fingers for about 6-7 years.     Patient Care Team: Senia Even, Kermit Balo, PA-C as PCP - General (Family Medicine) Sees dentist Sees eye doctor  Concerns: Here for physical, fasting  He has copy of childhood vaccines at home, will bring this to [place]  For a while now, months has upper left back pain, pains in left shoulder, hurts even to sleep.  Has to rotate often in sleep due to shoulder pains.   Has  pains in both forearms, and numbness in both hands.  Gets numbness in all fingers of right hand, 2nd - 4th fingers in left hand.  Is right handed.  No neck pain.  Currently seeing [facility] for workers comp related to ankle injury.  Reviewed their medical, surgical, family, social, medication, and allergy history and updated chart as appropriate.  Past Medical History:  Diagnosis Date   Anxiety    Asthma    Depression    History of frequent upper respiratory infection    since childhood    Past Surgical History:  Procedure Laterality Date   NO PAST SURGERIES  12/2020    Family History  Problem Relation Age of Onset   Hypertension Mother    Depression Mother    Kidney Stones Mother    Depression Father    Gout Father    Diabetes Maternal Grandmother    Depression Maternal Grandmother    Obesity Maternal Grandmother    Alcohol abuse Maternal Grandfather    Diabetes Paternal Grandmother    Obesity Paternal Grandmother    Diabetes Paternal Grandfather    Cancer Paternal Grandfather        prostate     Current Outpatient Medications:    buPROPion (WELLBUTRIN XL) 300 MG 24 hr tablet, TAKE 1 TABLET (300 MG TOTAL) BY MOUTH EVERY MORNING., Disp: 90 tablet, Rfl: 1   QVAR REDIHALER 80 MCG/ACT inhaler, INHALE 2 PUFFS INTO THE LUNGS TWICE A DAY, Disp: 30 g, Rfl: 3   albuterol (VENTOLIN  HFA) 108 (90 Base) MCG/ACT inhaler, Inhale 2 puffs into the lungs every 6 (six) hours as needed for wheezing or shortness of breath. (Patient not taking: Reported on 02/27/2022), Disp: 18 g, Rfl: 1  No Known Allergies   Review of Systems Constitutional: -fever, -chills, -sweats, -unexpected weight change, -decreased appetite, -fatigue Allergy: -sneezing, -itching, -congestion Dermatology: -changing moles, --rash, -lumps ENT: -runny nose, -ear pain, -sore throat, -hoarseness, -sinus pain, -teeth pain, - ringing in ears, -hearing loss, -nosebleeds Cardiology: -chest pain, -palpitations, -swelling, -difficulty breathing when lying flat, -waking up short of breath Respiratory: -cough, -shortness of breath, -difficulty breathing with exercise or exertion, -wheezing, -coughing up blood Gastroenterology: -abdominal pain, -nausea, -vomiting, -diarrhea, -constipation, -blood in stool, -changes in bowel movement, -difficulty swallowing or eating Hematology: -bleeding, -bruising  Musculoskeletal: +joint aches, +muscle aches, -joint swelling, +back pain, -neck pain, -cramping, -changes in gait Ophthalmology: denies vision changes, eye redness, itching, discharge Urology: -burning with urination, -difficulty urinating, -blood in urine, -urinary frequency, -urgency, -incontinence Neurology: -headache, -weakness, +tingling, +numbness, -memory loss, -falls, -dizziness Psychology: -depressed mood, -agitation, -sleep problems Male GU: no testicular mass, pain, no lymph nodes swollen, no swelling, no rash.     02/27/2022    1:05 PM 11/21/2021    9:06 AM  12/23/2020    1:18 PM 12/03/2020    2:29 PM  Depression screen PHQ 2/9  Decreased Interest 1 0 3 3  Down, Depressed, Hopeless 1 1 3 3   PHQ - 2 Score 2 1 6 6   Altered sleeping 3  3 1   Tired, decreased energy 1  3 1   Change in appetite 0  1 1  Feeling bad or failure about yourself  1  2 3   Trouble concentrating 2  2 1   Moving slowly or fidgety/restless 0  2  1  Suicidal thoughts 0  1 1  PHQ-9 Score 9  20 15   Difficult doing work/chores Not difficult at all  Very difficult         Objective:  BP 130/82   Pulse 94   Temp 98.6 F (37 C)   Ht 6' 3.5" (1.918 m)   Wt 288 lb 9.6 oz (130.9 kg)   BMI 35.60 kg/m   General appearance: alert, no distress, WD/WN, Caucasian male Skin: unremarkable HEENT: normocephalic, conjunctiva/corneas normal, sclerae anicteric, PERRLA, EOMi, nares patent, no discharge or erythema, pharynx normal Oral cavity: MMM, tongue normal, teeth normal Neck: Nontender, supple, no lymphadenopathy, no thyromegaly, no masses, normal ROM, no bruits Chest: non tender, normal shape and expansion Heart: RRR, normal S1, S2, no murmurs Lungs: CTA bilaterally, no wheezes, rhonchi, or rales Abdomen: +bs, soft, non tender, non distended, no masses, no hepatomegaly, no splenomegaly, no bruits Back: Left upper back tenderness over the rhomboids and supraspinatus, otherwise non tender, normal ROM, no scoliosis Musculoskeletal: Hands nontender, no swelling, no deformity, upper extremities non tender, no obvious deformity, normal ROM throughout, lower extremities non tender, no obvious deformity, normal ROM throughout Extremities: no edema, no cyanosis, no clubbing Pulses: 2+ symmetric, upper and lower extremities, normal cap refill Neurological: alert, oriented x 3, CN2-12 intact, strength normal upper extremities and lower extremities, sensation normal throughout, DTRs 2+ throughout, no cerebellar signs, gait normal, negative Phalen's and Tinel's Psychiatric: normal affect, behavior normal, pleasant  GU: deferred Rectal: deferred    Assessment and Plan :   Encounter Diagnoses  Name Primary?   Encounter for health maintenance examination in adult Yes   Elevated LFTs    BMI 35.0-35.9,adult    Vaccine counseling    Snoring    Moderate persistent asthma, unspecified whether complicated    Screening for thyroid disorder     Screening for diabetes mellitus    Screening for condition    Hand numbness    Chronic left shoulder pain    Pain in both upper extremities     This visit was a preventative care visit, also known as wellness visit or routine physical.   Topics typically include healthy lifestyle, diet, exercise, preventative care, vaccinations, sick and well care, proper use of emergency dept and after hours care, as well as other concerns.     Recommendations: Continue to return yearly for your annual wellness and preventative care visits.  This gives a chance to discuss healthy lifestyle, exercise, vaccinations, review your chart record, and perform screenings where appropriate.  I recommend you see your eye doctor yearly for routine vision care.  I recommend you see your dentist yearly for routine dental care including hygiene visits twice yearly.   Vaccination recommendations were reviewed Immunization History  Administered Date(s) Administered   PFIZER(Purple Top)SARS-COV-2 Vaccination 02/16/2020, 03/08/2020, 09/22/2020    Bring a copy of your vaccine card from childhood    Screening for cancer: Colon cancer screening: Age  45  Testicular cancer screening You should do a monthly self testicular exam if you are between 32-71 years old  We discussed PSA, prostate exam, and prostate cancer screening risks/benefits. Age 35    Skin cancer screening: Check your skin regularly for new changes, growing lesions, or other lesions of concern Come in for evaluation if you have skin lesions of concern.  Lung cancer screening: If you have a greater than 20 pack year history of tobacco use, then you may qualify for lung cancer screening with a chest CT scan.   Please call your insurance company to inquire about coverage for this test.  We currently don't have screenings for other cancers besides breast, cervical, colon, and lung cancers.  If you have a strong family history of cancer or have  other cancer screening concerns, please let me know.    Heart health: Get at least 150 minutes of aerobic exercise weekly Limit alcohol It is important to maintain a healthy blood pressure and healthy cholesterol numbers  Heart disease screening: Screening for heart disease includes screening for blood pressure, fasting lipids, glucose/diabetes screening, BMI height to weight ratio, reviewed of smoking status, physical activity, and diet.    Goals include blood pressure 120/80 or less, maintaining a healthy lipid/cholesterol profile, preventing diabetes or keeping diabetes numbers under good control, not smoking or using tobacco products, exercising most days per week or at least 150 minutes per week of exercise, and eating healthy variety of fruits and vegetables, healthy oils, and avoiding unhealthy food choices like fried food, fast food, high sugar and high cholesterol foods.    Other tests may possibly include EKG test, CT coronary calcium score, echocardiogram, exercise treadmill stress test.    Medical care options: I recommend you continue to seek care here first for routine care.  We try really hard to have available appointments Monday through Friday daytime hours for sick visits, acute visits, and physicals.  Urgent care should be used for after hours and weekends for significant issues that cannot wait till the next day.  The emergency department should be used for significant potentially life-threatening emergencies.  The emergency department is expensive, can often have long wait times for less significant concerns, so try to utilize primary care, urgent care, or telemedicine when possible to avoid unnecessary trips to the emergency department.  Virtual visits and telemedicine have been introduced since the pandemic started in 2020, and can be convenient ways to receive medical care.  We offer virtual appointments as well to assist you in a variety of options to seek medical  care.   Separate significant issues discussed: Asthma-doing fine on current therapy, Qvar daily, albuterol as needed.  PFT reviewed today.  He has not been on Qvar in the last 24 hours.  In general he does use it regularly though  Chronic left shoulder pain, upper back tenderness-shoulder exam normal, recommended massage therapy.  He will let me know if he wants formal referral  Hand numbness-likely carpal tunnel syndrome related.  Advised nighttime carpal tunnel splints for the next 2 weeks, Aleve over-the-counter for the next 7 to 10 days.  If not seeing improvement consider referral to orthopedics  BMI greater than 35-work on efforts to lose weight through healthy diet and exercise   Dejion was seen today for annual exam.  Diagnoses and all orders for this visit:  Encounter for health maintenance examination in adult -     Comprehensive metabolic panel -     CBC -  Lipid panel -     Cancel: Urinalysis, Routine w reflex microscopic -     Hemoglobin A1c -     Spirometry with graph -     Prolactin -     TSH -     Growth hormone  Elevated LFTs -     Comprehensive metabolic panel  BMI 35.0-35.9,adult  Vaccine counseling  Snoring  Moderate persistent asthma, unspecified whether complicated -     Spirometry with graph  Screening for thyroid disorder -     TSH -     Growth hormone  Screening for diabetes mellitus -     Hemoglobin A1c  Screening for condition -     Prolactin -     TSH -     Growth hormone  Hand numbness  Chronic left shoulder pain  Pain in both upper extremities   Follow-up pending labs, yearly for physical

## 2022-02-27 NOTE — Patient Instructions (Signed)
Massage therapy:  Kneaded by Rise Paganini [phone]   [email or web address]    Sharen Hint [facility] [address] [place] [address] [place], [place] [address] [phone] [email or web address]

## 2022-02-28 LAB — CBC
Hemoglobin: 16.2 g/dL (ref 13.0–17.7)
RBC: 5.56 x10E6/uL (ref 4.14–5.80)
WBC: 5.8 10*3/uL (ref 3.4–10.8)

## 2022-02-28 LAB — COMPREHENSIVE METABOLIC PANEL
BUN/Creatinine Ratio: 13 (ref 9–20)
CO2: 22 mmol/L (ref 20–29)
Creatinine, Ser: 0.78 mg/dL (ref 0.76–1.27)
Potassium: 4.4 mmol/L (ref 3.5–5.2)

## 2022-02-28 LAB — LIPID PANEL
Cholesterol, Total: 134 mg/dL (ref 100–199)
HDL: 33 mg/dL — ABNORMAL LOW (ref >39)

## 2022-03-02 LAB — COMPREHENSIVE METABOLIC PANEL
ALT: 91 IU/L — ABNORMAL HIGH (ref 0–44)
AST: 41 IU/L — ABNORMAL HIGH (ref 0–40)
Albumin/Globulin Ratio: 2.1 (ref 1.2–2.2)
Alkaline Phosphatase: 95 IU/L (ref 44–121)
BUN: 10 mg/dL (ref 6–20)
Calcium: 9.7 mg/dL (ref 8.7–10.2)
Globulin, Total: 2.4 g/dL (ref 1.5–4.5)
Glucose: 77 mg/dL (ref 70–99)
Sodium: 140 mmol/L (ref 134–144)
Total Protein: 7.4 g/dL (ref 6.0–8.5)

## 2022-03-02 LAB — GROWTH HORMONE: Growth Hormone: 1.8 ng/mL (ref 0.0–10.0)

## 2022-03-02 LAB — CBC: MCHC: 34.7 g/dL (ref 31.5–35.7)

## 2022-03-02 LAB — LIPID PANEL
LDL Chol Calc (NIH): 85 mg/dL (ref 0–99)
VLDL Cholesterol Cal: 16 mg/dL (ref 5–40)

## 2022-03-02 LAB — HEMOGLOBIN A1C: Est. average glucose Bld gHb Est-mCnc: 91 mg/dL

## 2022-03-02 LAB — PROLACTIN: Prolactin: 14.3 ng/mL (ref 4.0–15.2)

## 2022-03-02 LAB — TSH: TSH: 2.22 u[IU]/mL (ref 0.450–4.500)

## 2022-03-03 ENCOUNTER — Telehealth: Payer: Self-pay | Admitting: Medical

## 2022-03-03 ENCOUNTER — Other Ambulatory Visit: Payer: Self-pay | Admitting: Medical

## 2022-03-03 DIAGNOSIS — R7989 Other specified abnormal findings of blood chemistry: Secondary | ICD-10-CM

## 2022-03-03 LAB — SPECIMEN STATUS REPORT

## 2022-03-03 LAB — IRON: Iron: 81 ug/dL (ref 38–169)

## 2022-03-03 MED ORDER — QVAR REDIHALER 80 MCG/ACT IN AERB: INHALATION_SPRAY | RESPIRATORY_TRACT | 3 refills | Status: AC

## 2022-03-03 MED ORDER — WEGOVY 0.25 MG/0.5ML ~~LOC~~ SOAJ: 0.2500 mg | SUBCUTANEOUS | 1 refills | Status: DC

## 2022-03-03 MED ORDER — ALBUTEROL SULFATE HFA 108 (90 BASE) MCG/ACT IN AERS: 2.0000 | INHALATION_SPRAY | Freq: Four times a day (QID) | RESPIRATORY_TRACT | 1 refills | Status: AC | PRN

## 2022-03-03 NOTE — Telephone Encounter (Signed)
Pt does agree to liver ultrasound.

## 2022-03-08 LAB — FERRITIN: Ferritin: 190 ng/mL (ref 30–400)

## 2022-03-11 ENCOUNTER — Telehealth: Payer: Self-pay | Admitting: Medical

## 2022-03-11 NOTE — Telephone Encounter (Signed)
P.A. WEGOVY 

## 2022-03-15 NOTE — Telephone Encounter (Signed)
P.A. approved til 10/12/22, left message for pt, faxed pharmacy

## 2022-03-16 ENCOUNTER — Ambulatory Visit
Admission: RE | Admit: 2022-03-16 | Discharge: 2022-03-16 | Disposition: A | Discharge status: Home / Self Care | Payer: 59 | Source: Ambulatory Visit | Attending: Medical | Admitting: Medical

## 2022-03-16 DIAGNOSIS — R7989 Other specified abnormal findings of blood chemistry: Secondary | ICD-10-CM

## 2022-04-03 ENCOUNTER — Telehealth: Payer: Self-pay | Admitting: Medical

## 2022-04-03 NOTE — Telephone Encounter (Signed)
Recv'd fax from [facility] pharmacy req refill [employer] 0.25mg /0.5mg  pen inj to [id number]  `

## 2022-04-04 ENCOUNTER — Other Ambulatory Visit: Payer: Self-pay | Admitting: Medical

## 2022-04-04 MED ORDER — WEGOVY 0.5 MG/0.5ML ~~LOC~~ SOAJ: 0.5000 mg | SUBCUTANEOUS | 0 refills | Status: DC

## 2022-04-04 NOTE — Telephone Encounter (Signed)
Left message for pt

## 2022-04-25 ENCOUNTER — Ambulatory Visit: Payer: 59 | Admitting: Medical

## 2022-04-25 VITALS — BP 124/70 | HR 117 | Wt 285.0 lb

## 2022-04-25 DIAGNOSIS — R161 Splenomegaly, not elsewhere classified: Secondary | ICD-10-CM | POA: Diagnosis not present

## 2022-04-25 DIAGNOSIS — Z6835 Body mass index (BMI) 35.0-35.9, adult: Secondary | ICD-10-CM | POA: Diagnosis not present

## 2022-04-25 DIAGNOSIS — K760 Fatty (change of) liver, not elsewhere classified: Secondary | ICD-10-CM | POA: Diagnosis not present

## 2022-04-25 DIAGNOSIS — L689 Hypertrichosis, unspecified: Secondary | ICD-10-CM | POA: Insufficient documentation

## 2022-04-25 DIAGNOSIS — R7989 Other specified abnormal findings of blood chemistry: Secondary | ICD-10-CM

## 2022-04-25 MED ORDER — SAXENDA 18 MG/3ML ~~LOC~~ SOPN: 3.0000 mg | PEN_INJECTOR | Freq: Every day | SUBCUTANEOUS | 2 refills | Status: AC

## 2022-04-25 MED ORDER — METFORMIN HCL 500 MG PO TABS: 500.0000 mg | ORAL_TABLET | Freq: Every day | ORAL | 2 refills | Status: DC

## 2022-04-25 NOTE — Progress Notes (Signed)
Subjective:  Ray Blanchard is a 21 y.o. male who presents for Chief Complaint  Patient presents with   Follow-up     Here today accompanied by his girlfriend.  Here to discuss his recent ultrasound of abdomen.  He would like to on weight loss medicine but wegovy is not in stock.  He wants to pursue other options.  He is trying to eat healthy.  Exercise limited given his recent injury at work which is he in [employer]. for.  He had a recent update with orthopedics and may end up needing surgery for his left leg  He feels like he has excessive body hair and wants to see if there is something he can take to reduce the amount of body hair.  He does try shaving but this is so cumbersome he wants to see if there is other options  No other aggravating or relieving factors.    No other c/o.  Past Medical History:  Diagnosis Date   Anxiety    Asthma    Depression    History of frequent upper respiratory infection    since childhood   Current Outpatient Medications on File Prior to Visit  Medication Sig Dispense Refill   albuterol (VENTOLIN HFA) 108 (90 Base) MCG/ACT inhaler Inhale 2 puffs into the lungs every 6 (six) hours as needed for wheezing or shortness of breath. 18 g 1   beclomethasone (QVAR REDIHALER) 80 MCG/ACT inhaler INHALE 2 PUFFS INTO THE LUNGS TWICE A DAY 30 g 3   buPROPion (WELLBUTRIN XL) 300 MG 24 hr tablet TAKE 1 TABLET (300 MG TOTAL) BY MOUTH EVERY MORNING. 90 tablet 1   No current facility-administered medications on file prior to visit.     The following portions of the patient's history were reviewed and updated as appropriate: allergies, current medications, past family history, past medical history, past social history, past surgical history and problem list.  ROS Otherwise as in subjective above  Objective: BP 124/70   Pulse (!) 117   Wt 285 lb (129.3 kg)   BMI 35.15 kg/m   General appearance: alert, no distress, well developed, well  nourished Abdomen: +bs, soft, non tender, non distended, no masses, no hepatomegaly, no splenomegaly No extremely dense body hair but he feels there is excessive hair on his body    Assessment: Encounter Diagnoses  Name Primary?   Hepatic steatosis Yes   Elevated LFTs    Splenomegaly    BMI 35.0-35.9,adult    Excess body and facial hair      Plan: We discussed his recent elevated liver test.  Work-up has included negative hepatitis B and hepatitis C screening, normal iron level, and ultrasound was reviewed showing hepatic steatosis and splenomegaly.  We discussed the diagnosis of fatty liver disease and the need to lose weight through healthy diet, low-fat diet and exercise.  We discussed long-term possible complications of fatty liver disease.  Obesity-we will switch to Saxenda to see if this is available at the pharmacy.  He can use this in conjunction with lifestyle changes.  We discussed proper use of medication and nurse given demonstration on how to use the pen  Excess body hair-we discussed that there are no really good solutions for this but he can give metformin a try on the off chance it could work off label use.  We discussed risk and benefits of metformin.  Zaim was seen today for follow-up.  Diagnoses and all orders for this visit:  Hepatic steatosis  Elevated LFTs  Splenomegaly  BMI 35.0-35.9,adult  Excess body and facial hair  Other orders -     Liraglutide -Weight Management (SAXENDA) 18 MG/3ML SOPN; Inject 3 mg into the skin daily. Start 0.6mg  daily x 1 week, then increase to 1.2mg  daily x 1wk, then 1.8mg  daily x 1 wk, then 3mg  daily -     metFORMIN (GLUCOPHAGE) 500 MG tablet; Take 1 tablet (500 mg total) by mouth daily with breakfast.    Follow up: 6-8 weeks

## 2022-04-25 NOTE — Patient Instructions (Addendum)
Please call insurance to see if any of the following medicaiton are covered by your insurance for weight loss efforts  Wegovy Saxenda Qsymia Contrave   Medications similar to [facility] except indicated for diabetes: Mounjaro Ozempic Rybelsus   Check into the following meal plans Whole [address]   Consider supplement or dietary changes to get extra vitamin B6 and Vitamin E  Begin trial of Metformin once daily

## 2022-04-29 ENCOUNTER — Telehealth: Payer: Self-pay | Admitting: Medical

## 2022-04-30 NOTE — Telephone Encounter (Signed)
P.A. SAXENDA  

## 2022-05-06 NOTE — Telephone Encounter (Signed)
P.A. approved til 08/27/22, sent mychart message  

## 2022-05-17 ENCOUNTER — Encounter: Payer: Self-pay | Admitting: Internal Medicine

## 2022-06-07 ENCOUNTER — Other Ambulatory Visit: Payer: Self-pay | Admitting: Medical

## 2022-06-20 ENCOUNTER — Encounter: Payer: Self-pay | Admitting: Internal Medicine

## 2022-07-26 ENCOUNTER — Other Ambulatory Visit: Payer: Self-pay | Admitting: Medical

## 2022-09-05 ENCOUNTER — Telehealth: Payer: Self-pay | Admitting: Internal Medicine

## 2022-09-05 NOTE — Telephone Encounter (Signed)
P.A. Bernie Covey done through covermymeds. Waiting on response

## 2022-09-06 NOTE — Telephone Encounter (Signed)
Approved from 09/05/22- 01/03/2023 . Faxed to pharmacy and pt notified

## 2022-12-06 ENCOUNTER — Other Ambulatory Visit: Payer: Self-pay | Admitting: Medical

## 2023-03-03 ENCOUNTER — Telehealth: Payer: Self-pay | Admitting: Medical

## 2023-03-03 NOTE — Telephone Encounter (Signed)
P.A. SAXENDA RENEWAL °

## 2023-03-04 NOTE — Telephone Encounter (Signed)
Question response received & pt needs an appt in order for renewal to be completed

## 2023-03-13 NOTE — Telephone Encounter (Signed)
Left message for pt to see if he is still on Saxenda & if so needs follow up appt in order to do P.A. renewal

## 2023-06-24 ENCOUNTER — Other Ambulatory Visit: Payer: Self-pay | Admitting: Medical

## 2023-06-25 NOTE — Telephone Encounter (Signed)
Left message for pt to call back to schedule a visit. Been over a year since been in. Will refill once scheduled

## 2024-02-15 ENCOUNTER — Encounter (HOSPITAL_COMMUNITY): Payer: Self-pay

## 2024-02-15 ENCOUNTER — Emergency Department (HOSPITAL_COMMUNITY)
Admission: EM | Admit: 2024-02-15 | Discharge: 2024-02-15 | Disposition: A | Discharge status: Home / Self Care | Payer: Self-pay | Attending: Emergency Medicine | Admitting: Emergency Medicine

## 2024-02-15 ENCOUNTER — Other Ambulatory Visit: Payer: Self-pay

## 2024-02-15 ENCOUNTER — Emergency Department (HOSPITAL_COMMUNITY): Payer: Self-pay

## 2024-02-15 DIAGNOSIS — N23 Unspecified renal colic: Secondary | ICD-10-CM

## 2024-02-15 DIAGNOSIS — R748 Abnormal levels of other serum enzymes: Secondary | ICD-10-CM

## 2024-02-15 DIAGNOSIS — N132 Hydronephrosis with renal and ureteral calculous obstruction: Secondary | ICD-10-CM | POA: Insufficient documentation

## 2024-02-15 DIAGNOSIS — N201 Calculus of ureter: Secondary | ICD-10-CM

## 2024-02-15 DIAGNOSIS — R7401 Elevation of levels of liver transaminase levels: Secondary | ICD-10-CM | POA: Insufficient documentation

## 2024-02-15 LAB — URINALYSIS, ROUTINE W REFLEX MICROSCOPIC
Bilirubin Urine: NEGATIVE
Glucose, UA: NEGATIVE mg/dL
Ketones, ur: NEGATIVE mg/dL
Leukocytes,Ua: NEGATIVE
Nitrite: NEGATIVE
Protein, ur: 30 mg/dL — AB
RBC / HPF: >50 RBC/hpf (ref 0–5)
Specific Gravity, Urine: 1.008 (ref 1.005–1.030)
pH: 7 (ref 5.0–8.0)

## 2024-02-15 LAB — CBC WITH DIFFERENTIAL/PLATELET
Abs Immature Granulocytes: 0.02 10*3/uL (ref 0.00–0.07)
Basophils Absolute: 0 10*3/uL (ref 0.0–0.1)
Basophils Relative: 1 %
Eosinophils Absolute: 0.3 10*3/uL (ref 0.0–0.5)
Eosinophils Relative: 5 %
HCT: 48.7 % (ref 39.0–52.0)
Hemoglobin: 16.9 g/dL (ref 13.0–17.0)
Immature Granulocytes: 0 %
Lymphocytes Relative: 22 %
Lymphs Abs: 1.3 10*3/uL (ref 0.7–4.0)
MCH: 29.6 pg (ref 26.0–34.0)
MCHC: 34.7 g/dL (ref 30.0–36.0)
MCV: 85.4 fL (ref 80.0–100.0)
Monocytes Absolute: 0.4 10*3/uL (ref 0.1–1.0)
Monocytes Relative: 7 %
Neutro Abs: 3.9 10*3/uL (ref 1.7–7.7)
Neutrophils Relative %: 65 %
Platelets: 245 10*3/uL (ref 150–400)
RBC: 5.7 MIL/uL (ref 4.22–5.81)
RDW: 13.2 % (ref 11.5–15.5)
WBC: 6 10*3/uL (ref 4.0–10.5)
nRBC: 0 % (ref 0.0–0.2)

## 2024-02-15 LAB — COMPREHENSIVE METABOLIC PANEL WITH GFR
ALT: 156 U/L — ABNORMAL HIGH (ref 0–44)
AST: 88 U/L — ABNORMAL HIGH (ref 15–41)
Albumin: 4.6 g/dL (ref 3.5–5.0)
Alkaline Phosphatase: 91 U/L (ref 38–126)
Anion gap: 13 (ref 5–15)
BUN: 8 mg/dL (ref 6–20)
CO2: 23 mmol/L (ref 22–32)
Calcium: 9.7 mg/dL (ref 8.9–10.3)
Chloride: 105 mmol/L (ref 98–111)
Creatinine, Ser: 0.8 mg/dL (ref 0.61–1.24)
GFR, Estimated: >60 mL/min (ref >60)
Glucose, Bld: 117 mg/dL — ABNORMAL HIGH (ref 70–99)
Potassium: 3.6 mmol/L (ref 3.5–5.1)
Sodium: 141 mmol/L (ref 135–145)
Total Bilirubin: 1.1 mg/dL (ref 0.0–1.2)
Total Protein: 7.7 g/dL (ref 6.5–8.1)

## 2024-02-15 LAB — LIPASE, BLOOD: Lipase: 26 U/L (ref 11–51)

## 2024-02-15 MED ORDER — TAMSULOSIN HCL 0.4 MG PO CAPS: 0.4000 mg | ORAL_CAPSULE | Freq: Every day | ORAL | 0 refills | Status: AC

## 2024-02-15 MED ORDER — KETOROLAC TROMETHAMINE 15 MG/ML IJ SOLN
15.0000 mg | Freq: Once | INTRAMUSCULAR | Status: AC
  Administered 2024-02-15: 15 mg via INTRAVENOUS
  Filled 2024-02-15: qty 1

## 2024-02-15 MED ORDER — MORPHINE SULFATE (PF) 2 MG/ML IV SOLN
6.0000 mg | Freq: Once | INTRAVENOUS | Status: AC
  Administered 2024-02-15: 6 mg via INTRAVENOUS
  Filled 2024-02-15: qty 3

## 2024-02-15 MED ORDER — OXYCODONE-ACETAMINOPHEN 5-325 MG PO TABS: 1.0000 | ORAL_TABLET | Freq: Four times a day (QID) | ORAL | 0 refills | Status: DC | PRN

## 2024-02-15 MED ORDER — ONDANSETRON 4 MG PO TBDP
4.0000 mg | ORAL_TABLET | Freq: Three times a day (TID) | ORAL | 0 refills | Status: AC | PRN
Start: 2024-02-15 — End: ?

## 2024-02-15 MED ORDER — KETOROLAC TROMETHAMINE 30 MG/ML IJ SOLN: 30.0000 mg | Freq: Once | INTRAMUSCULAR | Status: DC

## 2024-02-15 MED ORDER — MORPHINE SULFATE (PF) 4 MG/ML IV SOLN
4.0000 mg | Freq: Once | INTRAVENOUS | Status: AC
  Administered 2024-02-15: 4 mg via INTRAVENOUS
  Filled 2024-02-15: qty 1

## 2024-02-15 MED ORDER — ONDANSETRON HCL 4 MG/2ML IJ SOLN
4.0000 mg | Freq: Once | INTRAMUSCULAR | Status: AC
  Administered 2024-02-15: 4 mg via INTRAVENOUS
  Filled 2024-02-15: qty 2

## 2024-02-15 NOTE — Discharge Instructions (Addendum)
 Your liver enzymes are slightly elevated.  This may be due to many reasons.  Please call your primary care doctor and have this rechecked as an outpatient Medications for pain and kidney stone have been sent to your pharmacy.  Please call the urologist for follow-up. Return if you are having new or worsening symptoms or your symptoms or not controlled with pain medication.  Return if you are having any fever or chills, nausea, vomiting, or inability to tolerate your medications.

## 2024-02-15 NOTE — ED Provider Triage Note (Signed)
 Emergency Medicine Provider Triage Evaluation Note  UNNAMED ZEIEN , a 23 y.o. male  was evaluated in triage.  Pt complains of right flank pain and hematuria with pain radiating to lower right abdomen.  Review of Systems  Positive: hematuria Negative:   Physical Exam  BP (!) 165/108   Pulse 89   Temp 98.2 F (36.8 C) (Oral)   Resp (!) 22   Ht 6\' 4"  (1.93 m)   Wt 134.3 kg   SpO2 100%   BMI 36.03 kg/m  Gen:   Awake, distress due to pain Resp:  Normal effort  MSK:   Moves extremities without difficulty  Other:    Medical Decision Making  Medically screening exam initiated at 10:34 AM.  Appropriate orders placed.  Josph Norfleet Maruyama was informed that the remainder of the evaluation will be completed by another provider, this initial triage assessment does not replace that evaluation, and the importance of remaining in the ED until their evaluation is complete.  Meds given for pain and nausea, labs, likely renal stone.    Owen Blowers P, DO 02/15/24 1034

## 2024-02-15 NOTE — ED Notes (Signed)
 Patient informed this RN that his pain worse worse and is requesting something more for pain.  Dr. Martina Sledge made aware via secure chat.

## 2024-02-15 NOTE — ED Notes (Signed)
 Patient to imaging.

## 2024-02-15 NOTE — ED Triage Notes (Addendum)
 Pt woke up with right flank and lower back pain. Pt urinating blood that started a month ago, went away and came back this morning. Denies CP. Pt stastes he took 1000mg  of ibuprofen at 0910

## 2024-02-15 NOTE — ED Provider Notes (Signed)
 [place] EMERGENCY DEPARTMENT AT [facility] Provider Note   CSN: [id number] Arrival date & time: 02/15/24  1012     History  Chief Complaint  Patient presents with  . Flank Pain    Ray Blanchard is a 23 y.o. male.  HPI 23 year old male presents today complaining of right-sided flank pain rating to his lower abdomen.  Patient began having symptoms a month ago and had some blood in his urine.  They then resolved until this morning.  Pain was present this morning after he woke up.  It caused him to have nausea and he vomited.  He received morphine  here in the ED and his pain has improved.  He denies fever, chills, frequency of urination, chest pain, diarrhea, or shortness of breath.  He states that both of his parents have had kidney stones in the past.  He has not previously had any known kidney stones.    Home Medications Prior to Admission medications   Medication Sig Start Date End Date Taking? Authorizing Provider  albuterol  (VENTOLIN  HFA) 108 (90 Base) MCG/ACT inhaler Inhale 2 puffs into the lungs every 6 (six) hours as needed for wheezing or shortness of breath. 03/03/22   Tysinger, Christiane Cowing, PA-C  beclomethasone (QVAR  REDIHALER) 80 MCG/ACT inhaler INHALE 2 PUFFS INTO THE LUNGS TWICE A DAY 03/03/22   Tysinger, Christiane Cowing, PA-C  buPROPion  (WELLBUTRIN  XL) 300 MG 24 hr tablet TAKE 1 TABLET (300 MG TOTAL) BY MOUTH EVERY MORNING. 06/07/22 06/07/23  Tysinger, Christiane Cowing, PA-C  Liraglutide  -Weight Management (SAXENDA ) 18 MG/3ML SOPN Inject 3 mg into the skin daily. Start 0.6mg  daily x 1 week, then increase to 1.2mg  daily x 1wk, then 1.8mg  daily x 1 wk, then 3mg  daily 04/25/22   Tysinger, Christiane Cowing, PA-C  metFORMIN  (GLUCOPHAGE ) 500 MG tablet TAKE 1 TABLET BY MOUTH EVERY DAY WITH BREAKFAST 12/06/22   Tysinger, Christiane Cowing, PA-C      Allergies    Methocarbamol    Review of Systems   Review of Systems  Physical Exam Updated Vital Signs BP (!) 165/108   Pulse 89   Temp 98.2 F (36.8 C)  (Oral)   Resp (!) 22   Ht 1.93 m (6\' 4" )   Wt 134.3 kg   SpO2 100%   BMI 36.03 kg/m  Physical Exam Vitals and nursing note reviewed.  Constitutional:      Appearance: He is well-developed.  HENT:     Head: Normocephalic and atraumatic.     Right Ear: External ear normal.     Left Ear: External ear normal.     Nose: Nose normal.  Eyes:     Extraocular Movements: Extraocular movements intact.  Neck:     Trachea: No tracheal deviation.  Cardiovascular:     Rate and Rhythm: Normal rate.  Pulmonary:     Effort: Pulmonary effort is normal.  Abdominal:     General: Abdomen is flat. Bowel sounds are normal.     Palpations: Abdomen is soft.     Tenderness: There is no abdominal tenderness.  Musculoskeletal:        General: Normal range of motion.     Cervical back: Normal range of motion.  Skin:    General: Skin is warm and dry.  Neurological:     Mental Status: He is alert and oriented to person, place, and time.  Psychiatric:        Mood and Affect: Mood normal.  Behavior: Behavior normal.     ED Results / Procedures / Treatments   Labs (all labs ordered are listed, but only abnormal results are displayed) Labs Reviewed  COMPREHENSIVE METABOLIC PANEL WITH GFR - Abnormal; Notable for the following components:      Result Value   Glucose, Bld 117 (*)    AST 88 (*)    ALT 156 (*)    All other components within normal limits  CBC WITH DIFFERENTIAL/PLATELET  LIPASE, BLOOD  URINALYSIS, ROUTINE W REFLEX MICROSCOPIC    EKG None  Radiology CT Renal Stone Study Result Date: 02/15/2024 CLINICAL DATA:  Abdominal pain kidney stone suspected EXAM: CT ABDOMEN AND PELVIS WITHOUT CONTRAST TECHNIQUE: Multidetector CT imaging of the abdomen and pelvis was performed following the standard protocol without IV contrast. RADIATION DOSE REDUCTION: This exam was performed according to the departmental dose-optimization program which includes automated exposure control, adjustment of  the mA and/or kV according to patient size and/or use of iterative reconstruction technique. COMPARISON:  Right upper quadrant ultrasound March 16, 2022 FINDINGS: Lower chest: No infiltrates or consolidations, no pleural effusions Hepatobiliary: Liver normal size no masses no biliary dilatation. Gallbladder unremarkable. No gallstones. Pancreas: Pancreas normal size. No masses calcifications or inflammatory changes. Spleen: Spleen normal size.  No masses. Adrenals/Urinary Tract: Adrenal glands are normal size. Follow-up recommended. Kidneys are normal. No masses. Subtle 1 mm calcification right kidney lower pole calices. 3.5 mm calcification right proximal ureter just distal to the ureteropelvic junction with subtle grade 1 hydronephrosis consistent with right-sided obstructive uropathy Stomach/Bowel: No small or large bowel obstruction or inflammatory changes. Moderate amount of residual fecal material throughout the colon without obstruction or constipation. Vascular/Lymphatic: No significant vascular findings are present. No enlarged abdominal or pelvic lymph nodes. Reproductive: .  No masses. Bladder unremarkable. Other: Anterior abdominal wall unremarkable without evidence of umbilical or inguinal hernias Musculoskeletal: Visualized portion of the thoracolumbar spine and pelvic structures grossly unremarkable without evidence of fracture bony abnormalities or soft tissue masses. IMPRESSION: 3.5 mm calcification right proximal ureter just distal to the ureteropelvic junction with subtle grade 1 hydronephrosis consistent with right-sided obstructive uropathy. Electronically Signed   By: Fredrich Jefferson M.D.   On: 02/15/2024 11:52    Procedures Procedures    Medications Ordered in ED Medications  morphine  (PF) 2 MG/ML injection 6 mg (6 mg Intravenous Given 02/15/24 1113)  ondansetron  (ZOFRAN ) injection 4 mg (4 mg Intravenous Given 02/15/24 1055)  morphine  (PF) 4 MG/ML injection 4 mg (4 mg Intravenous Given  02/15/24 1235)    ED Course/ Medical Decision Making/ A&P Clinical Course as of 02/17/24 0731  Fri Feb 15, 2024  1438 Urine is reviewed.  No evidence of acute infection it is consistent with renal colic with greater than 50 red blood cells. LFTs reviewed significant for elevation in AST and ALT.  Patient will need outpatient follow-up [DR]    Clinical Course User Index [DR] Auston Blush, MD                                 Medical Decision Making  23 year old male with right flank pain.  Differential diagnosis includes but is not limited to appendicitis, renal colic, UTI, prostatitis. Patient was seen with [facility].  Pain has improved.  CT was obtained and is significant for 3.5 mm ureteral stone. Plan pain medication, Flomax , antiemetics, and referral to urology. We have discussed return precautions and need for follow-up  patient voices understanding.        Final Clinical Impression(s) / ED Diagnoses Final diagnoses:  None    Rx / DC Orders ED Discharge Orders     None         Auston Blush, MD 02/17/24 [phone]

## 2024-03-10 ENCOUNTER — Emergency Department (HOSPITAL_COMMUNITY)
Admission: EM | Admit: 2024-03-10 | Discharge: 2024-03-10 | Disposition: A | Discharge status: Home / Self Care | Payer: Self-pay | Attending: Emergency Medicine | Admitting: Emergency Medicine

## 2024-03-10 ENCOUNTER — Encounter (HOSPITAL_COMMUNITY): Payer: Self-pay | Admitting: *Deleted

## 2024-03-10 ENCOUNTER — Emergency Department (HOSPITAL_COMMUNITY): Payer: Self-pay

## 2024-03-10 ENCOUNTER — Other Ambulatory Visit: Payer: Self-pay

## 2024-03-10 DIAGNOSIS — N132 Hydronephrosis with renal and ureteral calculous obstruction: Secondary | ICD-10-CM | POA: Insufficient documentation

## 2024-03-10 DIAGNOSIS — N200 Calculus of kidney: Secondary | ICD-10-CM

## 2024-03-10 DIAGNOSIS — J45909 Unspecified asthma, uncomplicated: Secondary | ICD-10-CM | POA: Insufficient documentation

## 2024-03-10 HISTORY — DX: Calculus of kidney: N20.0

## 2024-03-10 LAB — CBC WITH DIFFERENTIAL/PLATELET
Abs Immature Granulocytes: 0.02 10*3/uL (ref 0.00–0.07)
Basophils Absolute: 0.1 10*3/uL (ref 0.0–0.1)
Basophils Relative: 1 %
Eosinophils Absolute: 0.1 10*3/uL (ref 0.0–0.5)
Eosinophils Relative: 2 %
HCT: 45.6 % (ref 39.0–52.0)
Hemoglobin: 15.9 g/dL (ref 13.0–17.0)
Immature Granulocytes: 0 %
Lymphocytes Relative: 17 %
Lymphs Abs: 1.1 10*3/uL (ref 0.7–4.0)
MCH: 29.5 pg (ref 26.0–34.0)
MCHC: 34.9 g/dL (ref 30.0–36.0)
MCV: 84.6 fL (ref 80.0–100.0)
Monocytes Absolute: 0.4 10*3/uL (ref 0.1–1.0)
Monocytes Relative: 6 %
Neutro Abs: 4.7 10*3/uL (ref 1.7–7.7)
Neutrophils Relative %: 74 %
Platelets: 201 10*3/uL (ref 150–400)
RBC: 5.39 MIL/uL (ref 4.22–5.81)
RDW: 12.7 % (ref 11.5–15.5)
WBC: 6.4 10*3/uL (ref 4.0–10.5)
nRBC: 0 % (ref 0.0–0.2)

## 2024-03-10 LAB — URINALYSIS, ROUTINE W REFLEX MICROSCOPIC
Bilirubin Urine: NEGATIVE
Glucose, UA: NEGATIVE mg/dL
Ketones, ur: NEGATIVE mg/dL
Leukocytes,Ua: NEGATIVE
Nitrite: NEGATIVE
Protein, ur: NEGATIVE mg/dL
RBC / HPF: >50 RBC/hpf (ref 0–5)
Specific Gravity, Urine: 1.01 (ref 1.005–1.030)
pH: 6 (ref 5.0–8.0)

## 2024-03-10 LAB — BASIC METABOLIC PANEL WITH GFR
Anion gap: 10 (ref 5–15)
BUN: 11 mg/dL (ref 6–20)
CO2: 20 mmol/L — ABNORMAL LOW (ref 22–32)
Calcium: 9.2 mg/dL (ref 8.9–10.3)
Chloride: 105 mmol/L (ref 98–111)
Creatinine, Ser: 0.84 mg/dL (ref 0.61–1.24)
GFR, Estimated: >60 mL/min (ref >60)
Glucose, Bld: 92 mg/dL (ref 70–99)
Potassium: 3.8 mmol/L (ref 3.5–5.1)
Sodium: 135 mmol/L (ref 135–145)

## 2024-03-10 MED ORDER — OXYCODONE-ACETAMINOPHEN 5-325 MG PO TABS
1.0000 | ORAL_TABLET | Freq: Once | ORAL | Status: AC
  Administered 2024-03-10: 1 via ORAL
  Filled 2024-03-10: qty 1

## 2024-03-10 MED ORDER — KETOROLAC TROMETHAMINE 15 MG/ML IJ SOLN
15.0000 mg | Freq: Once | INTRAMUSCULAR | Status: AC
  Administered 2024-03-10: 15 mg via INTRAVENOUS
  Filled 2024-03-10: qty 1

## 2024-03-10 MED ORDER — SODIUM CHLORIDE 0.9 % IV BOLUS
1000.0000 mL | Freq: Once | INTRAVENOUS | Status: AC
  Administered 2024-03-10: 1000 mL via INTRAVENOUS

## 2024-03-10 MED ORDER — ONDANSETRON HCL 4 MG/2ML IJ SOLN
4.0000 mg | Freq: Once | INTRAMUSCULAR | Status: AC
  Administered 2024-03-10: 4 mg via INTRAVENOUS
  Filled 2024-03-10: qty 2

## 2024-03-10 MED ORDER — HYDROMORPHONE HCL 1 MG/ML IJ SOLN
1.0000 mg | Freq: Once | INTRAMUSCULAR | Status: AC
  Administered 2024-03-10: 1 mg via INTRAVENOUS
  Filled 2024-03-10: qty 1

## 2024-03-10 MED ORDER — OXYCODONE-ACETAMINOPHEN 5-325 MG PO TABS: 1.0000 | ORAL_TABLET | Freq: Four times a day (QID) | ORAL | 0 refills | Status: AC | PRN

## 2024-03-10 NOTE — ED Provider Notes (Signed)
 [place] EMERGENCY DEPARTMENT AT [facility] Provider Note   CSN: [id number] Arrival date & time: 03/10/24  1329     Patient presents with: Flank Pain and Emesis   Ray Blanchard is a 23 y.o. male.    Flank Pain  Emesis Patient with known kidney stone.  Had been seen in the ER on June 6.  Pains been doing well up until today when severe again.  Nausea and vomiting.  Some diaphoresis.  Took oxycodone  without relief.  Hurting again after having IV Dilaudid from her triage provider.  Has not eaten today.    Past Medical History:  Diagnosis Date   Anxiety    Asthma    Depression    History of frequent upper respiratory infection    since childhood   Kidney stone     Prior to Admission medications   Medication Sig Start Date End Date Taking? Authorizing Provider  albuterol  (VENTOLIN  HFA) 108 (90 Base) MCG/ACT inhaler Inhale 2 puffs into the lungs every 6 (six) hours as needed for wheezing or shortness of breath. 03/03/22   Tysinger, Alm RAMAN, PA-C  beclomethasone (QVAR  REDIHALER) 80 MCG/ACT inhaler INHALE 2 PUFFS INTO THE LUNGS TWICE A DAY 03/03/22   Tysinger, Alm RAMAN, PA-C  buPROPion  (WELLBUTRIN  XL) 300 MG 24 hr tablet TAKE 1 TABLET (300 MG TOTAL) BY MOUTH EVERY MORNING. 06/07/22 06/07/23  Tysinger, Alm RAMAN, PA-C  Liraglutide  -Weight Management (SAXENDA ) 18 MG/3ML SOPN Inject 3 mg into the skin daily. Start 0.6mg  daily x 1 week, then increase to 1.2mg  daily x 1wk, then 1.8mg  daily x 1 wk, then 3mg  daily 04/25/22   Tysinger, Alm RAMAN, PA-C  metFORMIN  (GLUCOPHAGE ) 500 MG tablet TAKE 1 TABLET BY MOUTH EVERY DAY WITH BREAKFAST 12/06/22   Tysinger, Alm RAMAN, PA-C  ondansetron  (ZOFRAN -ODT) 4 MG disintegrating tablet Take 1 tablet (4 mg total) by mouth every 8 (eight) hours as needed for nausea or vomiting. 02/15/24   Levander Houston, MD  oxyCODONE -acetaminophen  (PERCOCET/ROXICET) 5-325 MG tablet Take 1-2 tablets by mouth every 6 (six) hours as needed for severe pain (pain score 7-10).  03/10/24   Patsey Lot, MD  tamsulosin  (FLOMAX ) 0.4 MG CAPS capsule Take 1 capsule (0.4 mg total) by mouth daily. 02/15/24   Levander Houston, MD    Allergies: Methocarbamol    Review of Systems  Gastrointestinal:  Positive for vomiting.  Genitourinary:  Positive for flank pain.    Updated Vital Signs BP 138/80   Pulse 84   Temp 98.3 F (36.8 C)   Resp 16   Ht 6' 4 (1.93 m)   Wt 134.3 kg   SpO2 98%   BMI 36.04 kg/m   Physical Exam Vitals and nursing note reviewed.   Cardiovascular:     Rate and Rhythm: Regular rhythm.  Abdominal:     Tenderness: There is no abdominal tenderness.  Genitourinary:    Comments: CVA tenderness on right.  Skin:    General: Skin is warm.   Neurological:     Mental Status: He is alert.     (all labs ordered are listed, but only abnormal results are displayed) Labs Reviewed  URINALYSIS, ROUTINE W REFLEX MICROSCOPIC - Abnormal; Notable for the following components:      Result Value   Hgb urine dipstick LARGE (*)    Bacteria, UA RARE (*)    All other components within normal limits  BASIC METABOLIC PANEL WITH GFR - Abnormal; Notable for the following components:   CO2  20 (*)    All other components within normal limits  CBC WITH DIFFERENTIAL/PLATELET    EKG: None  Radiology: CT Renal Stone Study Result Date: 03/10/2024 CLINICAL DATA:  Abdominal/flank pain, stone suspected Recurrent right flank pain EXAM: CT ABDOMEN AND PELVIS WITHOUT CONTRAST TECHNIQUE: Multidetector CT imaging of the abdomen and pelvis was performed following the standard protocol without IV contrast. RADIATION DOSE REDUCTION: This exam was performed according to the departmental dose-optimization program which includes automated exposure control, adjustment of the mA and/or kV according to patient size and/or use of iterative reconstruction technique. COMPARISON:  CT 02/15/2024 FINDINGS: Lower chest: Clear lung bases. Hepatobiliary: Mild diffuse hepatic steatosis.  No focal liver abnormality. Gallbladder physiologically distended, no calcified stone. No biliary dilatation. Pancreas: No ductal dilatation or inflammation. Spleen: Enlarged, 16.8 cm cranial caudal. No focal splenic abnormality Adrenals/Urinary Tract: Normal adrenal glands. 7 mm right ureteral calculus has minimally progressed distally from prior exam, now at the L4 level. Right hydronephrosis has increased, remaining mild. Punctate nonobstructing stone in the lower right kidney. Mild right perinephric edema. No left hydronephrosis or urolithiasis. Decompressed urinary bladder. Stomach/Bowel: No bowel obstruction or inflammation. Punctate density in the distal appendix may represent a tiny appendicolith. The appendix is otherwise normal. There is no appendicitis. Vascular/Lymphatic: Normal caliber abdominal aorta. No enlarged lymph nodes in the abdomen or pelvis. Reproductive: Prostate is unremarkable. Other: No free air, free fluid, or intra-abdominal fluid collection. Musculoskeletal: There are no acute or suspicious osseous abnormalities. IMPRESSION: 1. The 7 mm right ureteral calculus has minimally progressed distally from prior exam. Right hydronephrosis has increased, remaining mild. 2. Punctate nonobstructing stone in the lower right kidney. 3. Mild hepatic steatosis. 4. Splenomegaly. Electronically Signed   By: Andrea Gasman M.D.   On: 03/10/2024 16:20     Procedures   Medications Ordered in the ED  sodium chloride 0.9 % bolus 1,000 mL (0 mLs Intravenous Stopped 03/10/24 1854)  ondansetron  (ZOFRAN ) injection 4 mg (4 mg Intravenous Given 03/10/24 1635)  HYDROmorphone (DILAUDID) injection 1 mg (1 mg Intravenous Given 03/10/24 1633)  HYDROmorphone (DILAUDID) injection 1 mg (1 mg Intravenous Given 03/10/24 1746)  HYDROmorphone (DILAUDID) injection 1 mg (1 mg Intravenous Given 03/10/24 1854)  ketorolac  (TORADOL ) 15 MG/ML injection 15 mg (15 mg Intravenous Given 03/10/24 1853)  oxyCODONE -acetaminophen   (PERCOCET/ROXICET) 5-325 MG per tablet 1 tablet (1 tablet Oral Given 03/10/24 2154)                                    Medical Decision Making Amount and/or Complexity of Data Reviewed Labs: ordered.  Risk Prescription drug management.   Patient right-sided flank pain.  Severe.  With history of kidney stone it is high on the differential.  CT scan had been repeated and does show relatively high stone.  Pain poorly controlled at this time.  Giving second dose of Dilaudid.  Will discuss with urology.  Still hurting after second dose of Dilaudid.  Pain more controlled after Dilaudid and Toradol .  Discussed with Dr. Lovie from urology.  Recommends more pain control here.  Since he is feeling somewhat better should be able to discharge home.  States he can get him in the morning in the office.  Set call in the a.m.  States he is calling the front desk so they know he is going to be coming.  If pain is uncontrolled may need a stent but would rather go  to the office.  Will discharge home.     Final diagnoses:  Kidney stone    ED Discharge Orders          Ordered    oxyCODONE -acetaminophen  (PERCOCET/ROXICET) 5-325 MG tablet  Every 6 hours PRN        03/10/24 2147               Patsey Lot, MD 03/10/24 2158

## 2024-03-10 NOTE — ED Provider Triage Note (Signed)
 Emergency Medicine Provider Triage Evaluation Note  Ray Blanchard , a 23 y.o. male  was evaluated in triage.  Pt complains of acute onset of right flank pain right lower quadrant abdominal pain at 12 noon.  On June 6 patient was seen for kidney stone with CT confirming proximal ureter stone.  Patient states all pain went away but then suddenly reoccurred here today.  Feels just like it did before.  Associated with nausea and vomiting.  And diaphoresis..  Review of Systems  Positive: Flank pain nausea vomiting diaphoresis Negative:   Physical Exam  BP (!) 162/108   Pulse 89   Temp 98.3 F (36.8 C)   Resp 20   SpO2 100%  Gen: Awake, in distress from pain Resp: Normal effort  MSK:  Moves extremities without difficulty  Abdomen: Soft nontender Other:  Medical Decision Making  Medically screening exam initiated at 3:37 PM.  Appropriate orders placed.  Ray Blanchard was informed that the remainder of the evaluation will be completed by another provider, this initial triage assessment does not replace that evaluation, and the importance of remaining in the ED until their evaluation is complete.  Symptoms and location of pain consistent with ureteral colic.  Kidney stone pain.  Suspect that the previous stone stopped moving and that has restarted.  Will get CT renal we will get the labs give a liter of fluid and antinausea medicine and pain medicine.     Ray Rindfleisch, MD 03/10/24 1540

## 2024-03-10 NOTE — ED Triage Notes (Signed)
 Pt was here 3 weeks ago with kidney stone and sent home with medications and flomax . Felt fine today, took a nap, woke up with bloody urine, testicular pain, emesis, nausea, and has pain to right flank area. Pt pale

## 2024-03-11 ENCOUNTER — Inpatient Hospital Stay (HOSPITAL_COMMUNITY): Payer: Self-pay | Admitting: Anesthesiology

## 2024-03-11 ENCOUNTER — Inpatient Hospital Stay (HOSPITAL_BASED_OUTPATIENT_CLINIC_OR_DEPARTMENT_OTHER): Payer: Self-pay | Admitting: Anesthesiology

## 2024-03-11 ENCOUNTER — Other Ambulatory Visit: Payer: Self-pay | Admitting: Urology

## 2024-03-11 ENCOUNTER — Inpatient Hospital Stay (HOSPITAL_COMMUNITY): Payer: Self-pay

## 2024-03-11 ENCOUNTER — Ambulatory Visit (HOSPITAL_COMMUNITY)
Admission: RE | Admit: 2024-03-11 | Discharge: 2024-03-11 | Disposition: A | Discharge status: Home / Self Care | Payer: Self-pay | Source: Ambulatory Visit | Attending: Urology | Admitting: Urology

## 2024-03-11 ENCOUNTER — Encounter (HOSPITAL_COMMUNITY): Payer: Self-pay | Admitting: Urology

## 2024-03-11 ENCOUNTER — Other Ambulatory Visit: Payer: Self-pay

## 2024-03-11 ENCOUNTER — Encounter (HOSPITAL_COMMUNITY)
Admission: RE | Disposition: A | Discharge status: Home / Self Care | Payer: Self-pay | Source: Ambulatory Visit | Attending: Urology

## 2024-03-11 DIAGNOSIS — Z466 Encounter for fitting and adjustment of urinary device: Secondary | ICD-10-CM | POA: Diagnosis not present

## 2024-03-11 DIAGNOSIS — N132 Hydronephrosis with renal and ureteral calculous obstruction: Secondary | ICD-10-CM | POA: Diagnosis not present

## 2024-03-11 DIAGNOSIS — N201 Calculus of ureter: Secondary | ICD-10-CM | POA: Diagnosis not present

## 2024-03-11 HISTORY — PX: CYSTOSCOPY/URETEROSCOPY/HOLMIUM LASER/STENT PLACEMENT: SHX6546

## 2024-03-11 SURGERY — CYSTOSCOPY/URETEROSCOPY/HOLMIUM LASER/STENT PLACEMENT
Anesthesia: General | Site: Ureter | Laterality: Right

## 2024-03-11 MED ORDER — FENTANYL CITRATE (PF) 100 MCG/2ML IJ SOLN
INTRAMUSCULAR | Status: AC
  Filled 2024-03-11: qty 2

## 2024-03-11 MED ORDER — MIDAZOLAM HCL 2 MG/2ML IJ SOLN
INTRAMUSCULAR | Status: AC
  Filled 2024-03-11: qty 2

## 2024-03-11 MED ORDER — CEFAZOLIN SODIUM-DEXTROSE 3-4 GM/150ML-% IV SOLN
3.0000 g | INTRAVENOUS | Status: AC
  Administered 2024-03-11: 3 g via INTRAVENOUS
  Filled 2024-03-11: qty 150

## 2024-03-11 MED ORDER — PROPOFOL 10 MG/ML IV BOLUS
INTRAVENOUS | Status: AC
Start: 2024-03-11 — End: 2024-03-11
  Filled 2024-03-11: qty 20

## 2024-03-11 MED ORDER — CHLORHEXIDINE GLUCONATE 0.12 % MT SOLN
15.0000 mL | Freq: Once | OROMUCOSAL | Status: AC
  Administered 2024-03-11: 15 mL via OROMUCOSAL

## 2024-03-11 MED ORDER — MIDAZOLAM HCL 5 MG/5ML IJ SOLN
INTRAMUSCULAR | Status: DC | PRN
  Administered 2024-03-11: 2 mg via INTRAVENOUS

## 2024-03-11 MED ORDER — FENTANYL CITRATE (PF) 100 MCG/2ML IJ SOLN
INTRAMUSCULAR | Status: DC | PRN
  Administered 2024-03-11: 25 ug via INTRAVENOUS
  Administered 2024-03-11: 50 ug via INTRAVENOUS
  Administered 2024-03-11: 25 ug via INTRAVENOUS

## 2024-03-11 MED ORDER — FENTANYL CITRATE PF 50 MCG/ML IJ SOSY: 25.0000 ug | PREFILLED_SYRINGE | INTRAMUSCULAR | Status: DC | PRN

## 2024-03-11 MED ORDER — SODIUM CHLORIDE 0.9 % IR SOLN
Status: DC | PRN
Start: 2024-03-11 — End: 2024-03-11
  Administered 2024-03-11: 3000 mL

## 2024-03-11 MED ORDER — LIDOCAINE HCL (PF) 2 % IJ SOLN
INTRAMUSCULAR | Status: AC
  Filled 2024-03-11: qty 5

## 2024-03-11 MED ORDER — ONDANSETRON HCL 4 MG/2ML IJ SOLN
INTRAMUSCULAR | Status: AC
  Filled 2024-03-11: qty 2

## 2024-03-11 MED ORDER — LIDOCAINE HCL (CARDIAC) PF 100 MG/5ML IV SOSY
PREFILLED_SYRINGE | INTRAVENOUS | Status: DC | PRN
  Administered 2024-03-11: 100 mg via INTRAVENOUS

## 2024-03-11 MED ORDER — GLYCOPYRROLATE 0.2 MG/ML IJ SOLN
INTRAMUSCULAR | Status: AC
  Filled 2024-03-11: qty 1

## 2024-03-11 MED ORDER — ONDANSETRON HCL 4 MG/2ML IJ SOLN
INTRAMUSCULAR | Status: DC | PRN
  Administered 2024-03-11: 4 mg via INTRAVENOUS

## 2024-03-11 MED ORDER — PROPOFOL 10 MG/ML IV BOLUS
INTRAVENOUS | Status: DC | PRN
  Administered 2024-03-11: 200 mg via INTRAVENOUS

## 2024-03-11 MED ORDER — DEXAMETHASONE SODIUM PHOSPHATE 10 MG/ML IJ SOLN
INTRAMUSCULAR | Status: AC
  Filled 2024-03-11: qty 1

## 2024-03-11 MED ORDER — LACTATED RINGERS IV SOLN: INTRAVENOUS | Status: DC

## 2024-03-11 MED ORDER — ACETAMINOPHEN 10 MG/ML IV SOLN: 1000.0000 mg | Freq: Once | INTRAVENOUS | Status: DC | PRN

## 2024-03-11 MED ORDER — IOHEXOL 300 MG/ML  SOLN
INTRAMUSCULAR | Status: DC | PRN
Start: 2024-03-11 — End: 2024-03-11
  Administered 2024-03-11: 10 mL

## 2024-03-11 SURGICAL SUPPLY — 19 items
BAG URO CATCHER STRL LF (MISCELLANEOUS) ×1 IMPLANT
BASKET LASER NITINOL 1.9FR (BASKET) IMPLANT
BASKET ZERO TIP NITINOL 2.4FR (BASKET) IMPLANT
CATH URETERAL DUAL LUMEN 10F (MISCELLANEOUS) IMPLANT
CATH URETL OPEN END 6FR 70 (CATHETERS) ×1 IMPLANT
CLOTH BEACON ORANGE TIMEOUT ST (SAFETY) ×1 IMPLANT
GLOVE BIO SURGEON STRL SZ7.5 (GLOVE) ×1 IMPLANT
GOWN STRL REUS W/ TWL XL LVL3 (GOWN DISPOSABLE) ×1 IMPLANT
GUIDEWIRE ANG ZIPWIRE 038X150 (WIRE) IMPLANT
GUIDEWIRE STR DUAL SENSOR (WIRE) ×1 IMPLANT
KIT TURNOVER KIT A (KITS) ×1 IMPLANT
MANIFOLD NEPTUNE II (INSTRUMENTS) ×1 IMPLANT
PACK CYSTO (CUSTOM PROCEDURE TRAY) ×1 IMPLANT
SHEATH NAVIGATOR HD 11/13X28 (SHEATH) IMPLANT
SHEATH NAVIGATOR HD 11/13X36 (SHEATH) IMPLANT
STENT URET 6FRX26 CONTOUR (STENTS) IMPLANT
TRACTIP FLEXIVA PULS ID 200XHI (Laser) IMPLANT
TUBING CONNECTING 10 (TUBING) ×1 IMPLANT
TUBING UROLOGY SET (TUBING) ×1 IMPLANT

## 2024-03-11 NOTE — Discharge Instructions (Addendum)

## 2024-03-11 NOTE — Anesthesia Preprocedure Evaluation (Addendum)
 Anesthesia Evaluation  Patient identified by MRN, date of birth, ID band Patient awake    Reviewed: Allergy & Precautions, NPO status , Patient's Chart, lab work & pertinent test results  Airway Mallampati: III  TM Distance: >3 FB Neck ROM: Full    Dental no notable dental hx.    Pulmonary asthma    Pulmonary exam normal        Cardiovascular negative cardio ROS  Rhythm:Regular Rate:Normal     Neuro/Psych   Anxiety Depression    negative neurological ROS     GI/Hepatic negative GI ROS, Neg liver ROS,,,  Endo/Other  negative endocrine ROS    Renal/GU Renal disease  negative genitourinary   Musculoskeletal negative musculoskeletal ROS (+)    Abdominal Normal abdominal exam  (+)   Peds  Hematology Lab Results      Component                Value               Date                      WBC                      6.4                 03/10/2024                HGB                      15.9                03/10/2024                HCT                      45.6                03/10/2024                MCV                      84.6                03/10/2024                PLT                      201                 03/10/2024              Anesthesia Other Findings   Reproductive/Obstetrics                             Anesthesia Physical Anesthesia Plan  ASA: 2  Anesthesia Plan: General   Post-op Pain Management:    Induction: Intravenous  PONV Risk Score and Plan: 2 and Ondansetron , Dexamethasone, Midazolam and Treatment may vary due to age or medical condition  Airway Management Planned: Mask and LMA  Additional Equipment: None  Intra-op Plan:   Post-operative Plan: Extubation in OR  Informed Consent: I have reviewed the patients History and Physical, chart, labs and discussed the procedure including the risks, benefits and alternatives for the proposed anesthesia with the patient or  authorized representative  who has indicated his/her understanding and acceptance.     Dental advisory given  Plan Discussed with: CRNA  Anesthesia Plan Comments:        Anesthesia Quick Evaluation

## 2024-03-11 NOTE — Op Note (Signed)
 Operative Note  Preoperative diagnosis:  1.  Right ureteral calculus  Postoperative diagnosis: 1.  Right ureteral calculus  Procedure(s): 1.  Cystoscopy with right retrograde pyelogram, right ureteroscopy with laser lithotripsy and stone basketing, right ureteral stent placement  Surgeon: Sherwood Edison, MD  Assistants: None  Anesthesia: General  Complications: None immediate  EBL: Minimal  Specimens: 1.  Ureteral calculus  Drains/Catheters: 1.  6 x 26 double-J ureteral stent  Intraoperative findings: 1.  Normal urethra and bladder 2.  7 mm proximal right ureteral calculus fragmented and basket extracted.  3.  Right retrograde pyelogram revealed mild hydronephrosis.  There was no filling defect in the kidney  Indication: 23 year old male with a right ureteral calculus presents for the previously mentioned operation.  Description of procedure:  The patient was identified and consent was obtained.  The patient was taken to the operating room and placed in the supine position.  The patient was placed under general anesthesia.  Perioperative antibiotics were administered.  The patient was placed in dorsal lithotomy.  Patient was prepped and draped in a standard sterile fashion and a timeout was performed.  A 21 French rigid cystoscope was advanced into the urethra and into the bladder.  Complete cystoscopy was performed with findings noted above.  The right ureter was cannulated with a sensor wire which was advanced up to the kidney under fluoroscopic guidance.  Semirigid ureteroscopy was performed alongside the wire up to the stone which was laser fragmented to smaller fragments followed by basket extraction.  I advanced the scope up to the renal pelvis and no clinically significant stone fragments remained.  I shot a retrograde pyelogram through the scope with the findings noted above.  I withdrew the scope visualizing ureter upon removal and there were no clinically significant  stones remaining and no significant ureteral trauma.  There was some ureteral edema at the level of stone impaction.  I backloaded the wire onto a rigid cystoscope and advanced that into the bladder followed by routine placement of a 6 x 26 double-J ureteral stent.  Fluoroscopy confirmed proximal placement and direct visualization confirmed a good coil within the bladder.  I drained the bladder withdrew the scope.  Patient tolerated the procedure well was stable postoperatively.  Plan: Follow-up in 1 week for stent removal

## 2024-03-11 NOTE — H&P (Signed)
 CC/HPI: CC: Right ureteral calculus  HPI:  03/11/2024  23 year old male has been having intermittent severe right-sided flank pain for the last 3 and half weeks. Initially diagnosed with kidney stone on 02/15/2024 that showed a 3.5 mm right proximal ureteral calculus.Went to the emergency department again yesterday with continued abdominal pain, nausea, vomiting. Repeat CT scan was performed that showed 7 mm right ureteral calculus minimally progressed distally with right-sided hydronephrosis. No left-sided hydronephrosis or nephrolithiasis. He continues to have significant nausea and intermittent flank pain. Pain is about 4 or 5 out of 10. He ate breakfast at 9:15 AM. Never had stones before. Denies any fever, chills. Creatinine in the emergency department was 0.84, no leukocytosis, urinalysis with rare bacteria, greater than 50 RBC, 0-5 WBC.     ALLERGIES: methocarbamol    MEDICATIONS: Tamsulosin  HCl 0.4 MG Capsule  Ondansetron   oxyCODONE  HCl     GU PSH: None   NON-GU PSH: Ankle Arthroscopy/surgery - 09/11/2022     GU PMH: None   NON-GU PMH: Anxiety Asthma Depression Diabetes Type 2    FAMILY HISTORY: Hematuria - Father Hypertension - Mother Kidney Stones - Father, Mother   SOCIAL HISTORY: Marital Status: Single Ethnicity: Not Hispanic Or Latino; Race: White Therapist, sports.  Drinks 3 caffeinated drinks per day. Patient's occupation is/was NA.    REVIEW OF SYSTEMS:    GU Review Male:   Patient reports frequent urination, burning/ pain with urination, stream starts and stops, trouble starting your stream, have to strain to urinate , and penile pain. Patient denies hard to postpone urination, get up at night to urinate, leakage of urine, and erection problems.  Gastrointestinal (Upper):   Patient reports nausea, vomiting, and indigestion/ heartburn.   Gastrointestinal (Lower):   Patient reports constipation. Patient denies diarrhea.  Constitutional:   Patient reports fever, night  sweats, and fatigue. Patient denies weight loss.  Skin:   Patient denies skin rash/ lesion and itching.  Eyes:   Patient denies blurred vision and double vision.  Ears/ Nose/ Throat:   Patient denies sore throat and sinus problems.  Hematologic/Lymphatic:   Patient denies swollen glands and easy bruising.  Cardiovascular:   Patient denies leg swelling and chest pains.  Respiratory:   Patient denies cough and shortness of breath.  Endocrine:   Patient denies excessive thirst.  Musculoskeletal:   Patient reports back pain. Patient denies joint pain.  Neurological:   Patient reports dizziness. Patient denies headaches.  Psychologic:   Patient denies depression and anxiety.   VITAL SIGNS:      03/11/2024 10:08 AM  Weight 295 lb / 133.81 kg  Height 76 in / 193.04 cm  BP 143/93 mmHg  Pulse 94 /min  Temperature 98.2 F / 36.7 C  BMI 35.9 kg/m   MULTI-SYSTEM PHYSICAL EXAMINATION:    Constitutional: Well-nourished. No physical deformities. Normally developed. Good grooming. Appears to not feel well from the stone  Respiratory: No labored breathing, no use of accessory muscles.   Cardiovascular: Normal temperature, normal extremity pulses, no swelling, no varicosities.  Skin: No paleness, no jaundice, no cyanosis. No lesion, no ulcer, no rash.  Neurologic / Psychiatric: Oriented to time, oriented to place, oriented to person. No depression, no anxiety, no agitation.  Gastrointestinal: No mass, no tenderness, no rigidity, obese abdomen. No CVA tenderness bilaterally  Eyes: Normal conjunctivae. Normal eyelids.  Musculoskeletal: Normal gait and station of head and neck.     Complexity of Data:  Source Of History:  Patient  Lab Test  Review:   BMP  Records Review:   Previous Doctor Records, Previous Patient Records  Urine Test Review:   Urinalysis  X-Ray Review: C.T. Abdomen/Pelvis: Reviewed Films. Reviewed Report. Discussed With Patient.     PROCEDURES:         KUB - [id number]  A single view  of the abdomen is obtained.  Proximal to mid right ureteral calculus readily visible on KUB      Patient confirmed No Neulasta OnPro Device.           Ketoralac 60mg  - [id number], [id number] 0 wasted   Qty: 60 Adm. By: [place] [employer]  Unit: mg Adm. On: 03/11/2024 10:48 AM  Route: IM Lot No: [id number]  Freq: None Exp. Date: 05/10/2025    Mfgr.:   Site: Right Buttock         Phenergan 25mg  - [id number], [id number] 0 wasted   Qty: 25 Adm. By: [employer] [employer]  Unit: mg Adm. On: 03/11/2024 10:47 AM  Route: SQ Lot No: [id number]  Freq: None Exp. Date: 08/10/2024    Mfgr.:   Site: Left Buttock   ASSESSMENT:      ICD-10 Details  1 GU:   Ureteral calculus - N20.1 Undiagnosed New Problem  2   Renal colic - N23 Undiagnosed New Problem   PLAN:           Orders X-Rays: KUB          Schedule         Document Letter(s):  Created for Patient: Clinical Summary         Notes:   We discussed the management of urinary stones. These options include observation, ureteroscopy, and shockwave lithotripsy. We discussed which options are relevant to these particular stones. We discussed the natural history of stones as well as the complications of untreated stones and the impact on quality of life without treatment as well as with each of the above listed treatments. We also discussed the efficacy of each treatment in its ability to clear the stone burden. With any of these management options I discussed the signs and symptoms of infection and the need for emergent treatment should these be experienced. For each option we discussed the ability of each procedure to clear the patient of their stone burden.   For observation I described the risks which include but are not limited to silent renal damage, life-threatening infection, need for emergent surgery, failure to pass stone, and pain.   For ureteroscopy I described the risks which include heart attack, stroke, pulmonary embolus, death, bleeding, infection, damage to  contiguous structures, positioning injury, ureteral stricture, ureteral avulsion, ureteral injury, need for ureteral stent, inability to perform ureteroscopy, need for an interval procedure, inability to clear stone burden, stent discomfort and pain.   For shockwave lithotripsy I described the risks which include arrhythmia, kidney contusion, kidney hemorrhage, need for transfusion, pain, inability to break up stone, inability to pass stone fragments, Steinstrasse, infection associated with obstructing stones, need for different surgical procedure, need for repeat shockwave lithotripsy.   He continues to have nausea and pain. Will proceed with ureteroscopy tonight.   CC: Ray Blanchard     Signed by Sherwood Edison, III, M.D. on 03/11/24 at 11:09 AM (EDT

## 2024-03-11 NOTE — Anesthesia Procedure Notes (Signed)
 Procedure Name: LMA Insertion Date/Time: 03/11/2024 5:50 PM  Performed by: Nada Corean CROME, CRNAPre-anesthesia Checklist: Emergency Drugs available, Patient identified, Suction available, Patient being monitored and Timeout performed Patient Re-evaluated:Patient Re-evaluated prior to induction Oxygen Delivery Method: Circle system utilized Preoxygenation: Pre-oxygenation with 100% oxygen Induction Type: IV induction Ventilation: Mask ventilation without difficulty LMA: LMA inserted LMA Size: 5.0 Tube type: Oral Number of attempts: 1 Placement Confirmation: positive ETCO2 Tube secured with: Tape Dental Injury: Teeth and Oropharynx as per pre-operative assessment

## 2024-03-11 NOTE — Transfer of Care (Signed)
 Immediate Anesthesia Transfer of Care Note  Patient: Ray Blanchard  Procedure(s) Performed: CYSTOSCOPY/URETEROSCOPY/HOLMIUM LASER/STENT PLACEMENT (Right: Ureter)  Patient Location: PACU  Anesthesia Type:General  Level of Consciousness: drowsy, patient cooperative, and responds to stimulation  Airway & Oxygen Therapy: Patient Spontanous Breathing and Patient connected to face mask oxygen  Post-op Assessment: Report given to RN and Post -op Vital signs reviewed and stable  Post vital signs: Reviewed and stable  Last Vitals:  Vitals Value Taken Time  BP 123/85 03/11/24 18:37  Temp 97   Pulse 77 03/11/24 18:38  Resp 19 03/11/24 18:38  SpO2 100 % 03/11/24 18:38  Vitals shown include unfiled device data.  Last Pain:  Vitals:   03/11/24 1519  TempSrc:   PainSc: 0-No pain         Complications: No notable events documented.

## 2024-03-12 ENCOUNTER — Encounter (HOSPITAL_COMMUNITY): Payer: Self-pay | Admitting: Urology

## 2024-03-17 NOTE — Anesthesia Postprocedure Evaluation (Signed)
 Anesthesia Post Note  Patient: Ray Blanchard  Procedure(s) Performed: CYSTOSCOPY/URETEROSCOPY/HOLMIUM LASER/STENT PLACEMENT (Right: Ureter)     Patient location during evaluation: PACU Anesthesia Type: General Level of consciousness: awake and alert Pain management: pain level controlled Vital Signs Assessment: post-procedure vital signs reviewed and stable Respiratory status: spontaneous breathing, nonlabored ventilation, respiratory function stable and patient connected to nasal cannula oxygen Cardiovascular status: blood pressure returned to baseline and stable Postop Assessment: no apparent nausea or vomiting Anesthetic complications: no   No notable events documented.  Last Vitals:  Vitals:   03/11/24 1900 03/11/24 1915  BP: (!) 142/97 (!) 148/93  Pulse: 85 73  Resp: 14 20  Temp:    SpO2: 98% 98%    Last Pain:  Vitals:   03/11/24 1915  TempSrc:   PainSc: 0-No pain                 Cordella P Latonja Bobeck

## 2024-03-22 DIAGNOSIS — Z419 Encounter for procedure for purposes other than remedying health state, unspecified: Secondary | ICD-10-CM | POA: Diagnosis not present

## 2024-04-22 DIAGNOSIS — Z419 Encounter for procedure for purposes other than remedying health state, unspecified: Secondary | ICD-10-CM | POA: Diagnosis not present

## 2024-05-23 DIAGNOSIS — Z419 Encounter for procedure for purposes other than remedying health state, unspecified: Secondary | ICD-10-CM | POA: Diagnosis not present

## 2024-06-22 DIAGNOSIS — Z419 Encounter for procedure for purposes other than remedying health state, unspecified: Secondary | ICD-10-CM | POA: Diagnosis not present

## 2024-07-23 DIAGNOSIS — Z419 Encounter for procedure for purposes other than remedying health state, unspecified: Secondary | ICD-10-CM | POA: Diagnosis not present
